# Patient Record
Sex: Female | Born: 1980 | Race: White | Hispanic: No | Marital: Married | Smoking: Never smoker
Health system: Southern US, Community
[De-identification: ages and names within clinical notes are randomized; demographics above are authoritative.]

## PROBLEM LIST (undated history)

## (undated) DIAGNOSIS — Z8759 Personal history of other complications of pregnancy, childbirth and the puerperium: Secondary | ICD-10-CM

## (undated) DIAGNOSIS — N390 Urinary tract infection, site not specified: Secondary | ICD-10-CM

## (undated) DIAGNOSIS — O1205 Gestational edema, complicating the puerperium: Secondary | ICD-10-CM

## (undated) DIAGNOSIS — Z862 Personal history of diseases of the blood and blood-forming organs and certain disorders involving the immune mechanism: Secondary | ICD-10-CM

## (undated) DIAGNOSIS — N83209 Unspecified ovarian cyst, unspecified side: Secondary | ICD-10-CM

## (undated) DIAGNOSIS — O139 Gestational [pregnancy-induced] hypertension without significant proteinuria, unspecified trimester: Secondary | ICD-10-CM

## (undated) DIAGNOSIS — Z87442 Personal history of urinary calculi: Secondary | ICD-10-CM

## (undated) DIAGNOSIS — N979 Female infertility, unspecified: Secondary | ICD-10-CM

## (undated) DIAGNOSIS — N189 Chronic kidney disease, unspecified: Secondary | ICD-10-CM

## (undated) DIAGNOSIS — IMO0001 Reserved for inherently not codable concepts without codable children: Secondary | ICD-10-CM

## (undated) DIAGNOSIS — Z8742 Personal history of other diseases of the female genital tract: Secondary | ICD-10-CM

## (undated) DIAGNOSIS — N923 Ovulation bleeding: Secondary | ICD-10-CM

## (undated) DIAGNOSIS — Z973 Presence of spectacles and contact lenses: Secondary | ICD-10-CM

## (undated) DIAGNOSIS — N3020 Other chronic cystitis without hematuria: Secondary | ICD-10-CM

## (undated) DIAGNOSIS — D62 Acute posthemorrhagic anemia: Secondary | ICD-10-CM

## (undated) HISTORY — PX: BREAST ENHANCEMENT SURGERY: SHX7

## (undated) HISTORY — PX: BREAST SURGERY: SHX581

## (undated) SURGERY — Surgical Case
Anesthesia: *Unknown

---

## 2004-12-13 ENCOUNTER — Emergency Department (HOSPITAL_COMMUNITY): Admission: EM | Admit: 2004-12-13 | Discharge: 2004-12-13 | Payer: Self-pay | Admitting: Emergency Medicine

## 2005-01-01 ENCOUNTER — Emergency Department (HOSPITAL_COMMUNITY): Admission: EM | Admit: 2005-01-01 | Discharge: 2005-01-02 | Payer: Self-pay | Admitting: Emergency Medicine

## 2007-01-13 IMAGING — CT CT ABDOMEN W/ CM
2 of 5 series · 17 of 46 positions shown, 19 images · IV contrast (omnipaque)
Comparison: None.

CLINICAL DATA: Lower abdominal pain.
 ABDOMEN CT WITH CONTRAST:
TECHNIQUE: Multidetector CT imaging of the abdomen was performed following the standard protocol during bolus administration of intravenous contrast.
 Contrast:  125 cc Omnipaque 300.
TECHNIQUE: Multidetector CT imaging of the pelvis was performed following the standard protocol during bolus administration of intravenous contrast.

[Series 2: abd_pel 5.0 b40f st · axial · 0.59mm/px · z∈[-472,-47]mm · 14 of 95 slices shown, 16 images]
[im 5/95  soft-tissue]
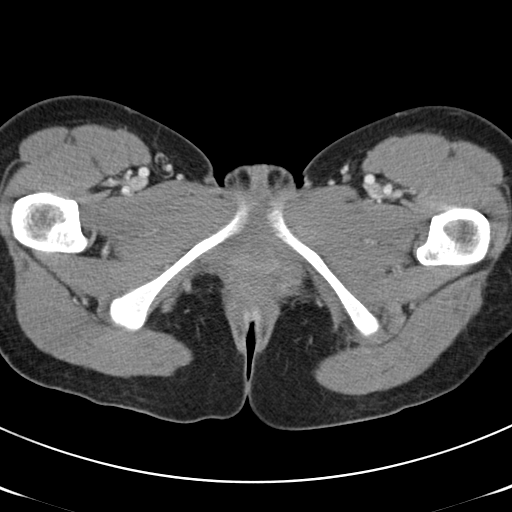
[im 5/95  bone]
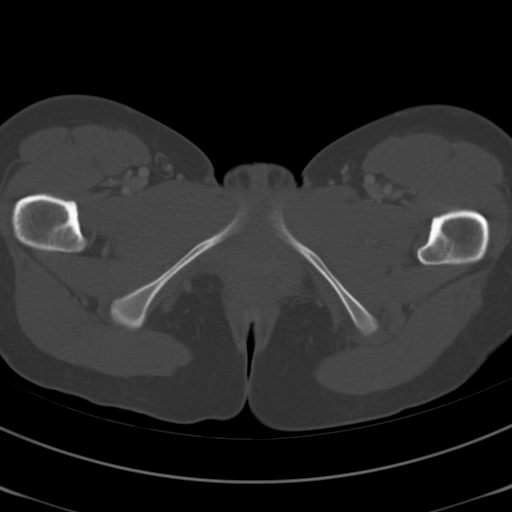
[im 15/95  soft-tissue]
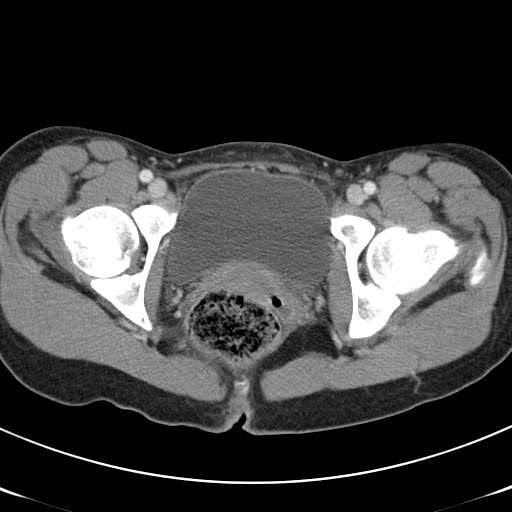
[im 19/95  soft-tissue]
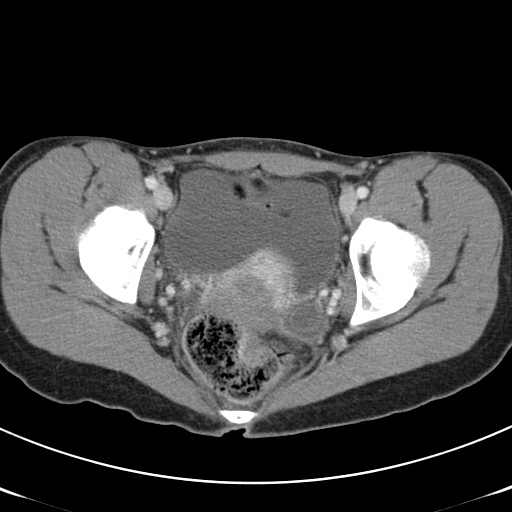
[im 24/95  soft-tissue]
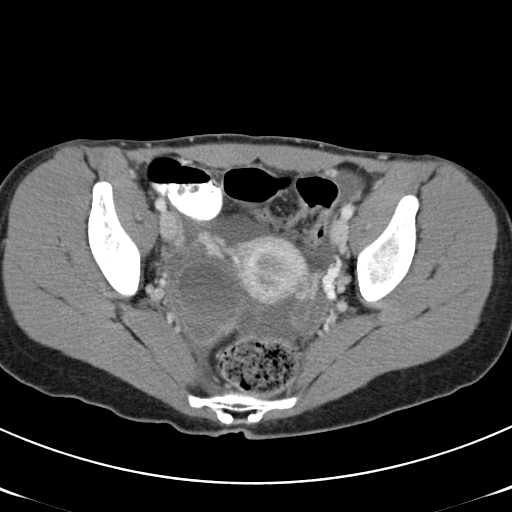
[im 33/95  soft-tissue]
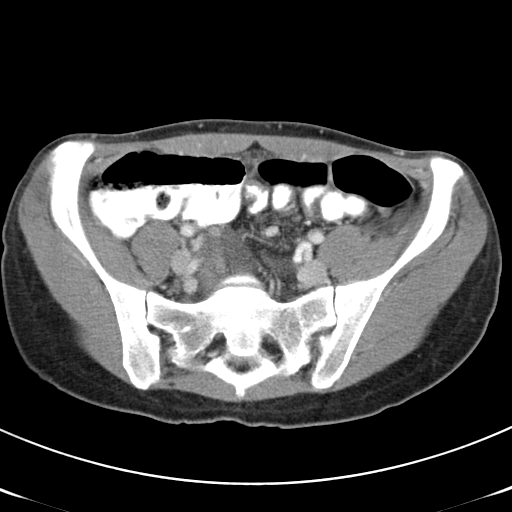
[im 38/95  soft-tissue]
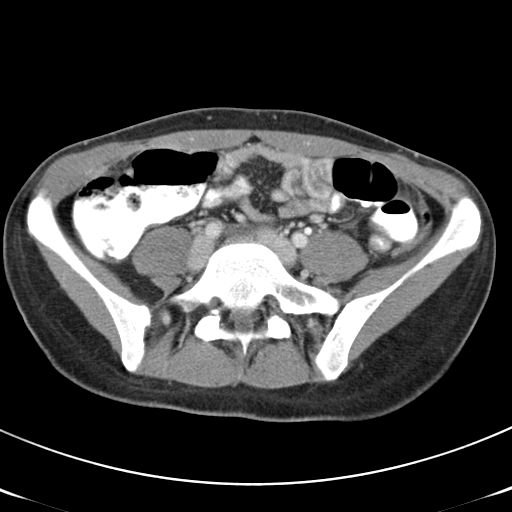
[im 43/95  soft-tissue]
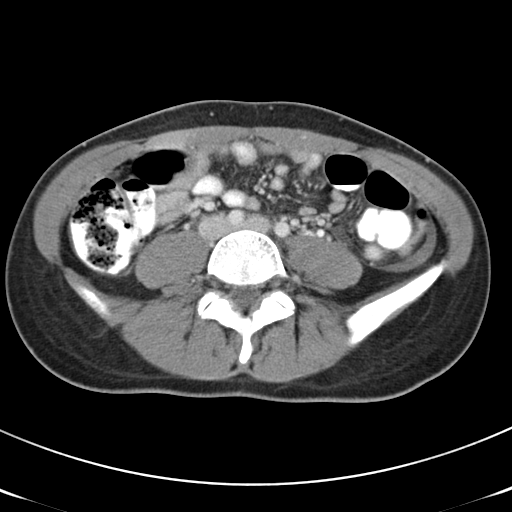
[im 52/95  soft-tissue]
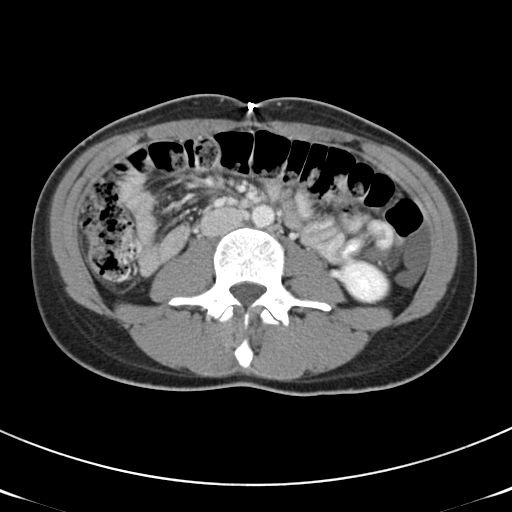
[im 57/95  soft-tissue]
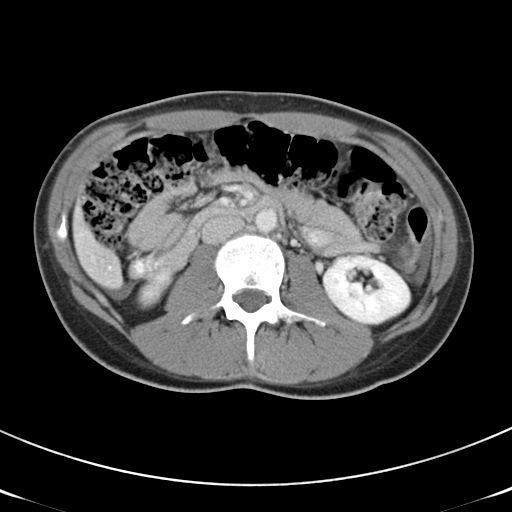
[im 57/95  bone]
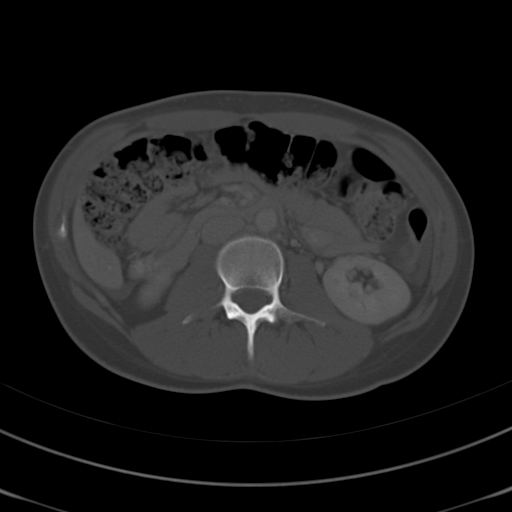
[im 62/95  soft-tissue]
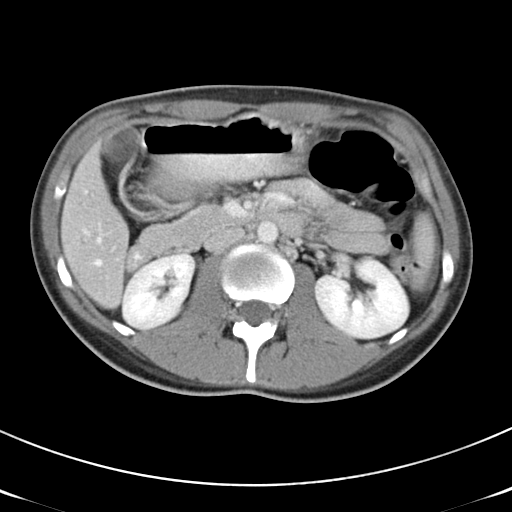
[im 71/95  soft-tissue]
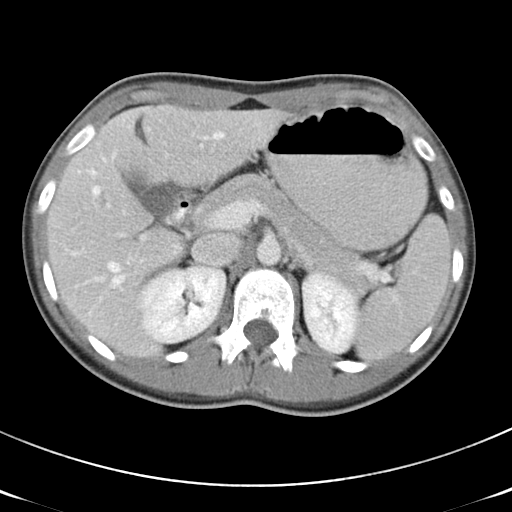
[im 76/95  soft-tissue]
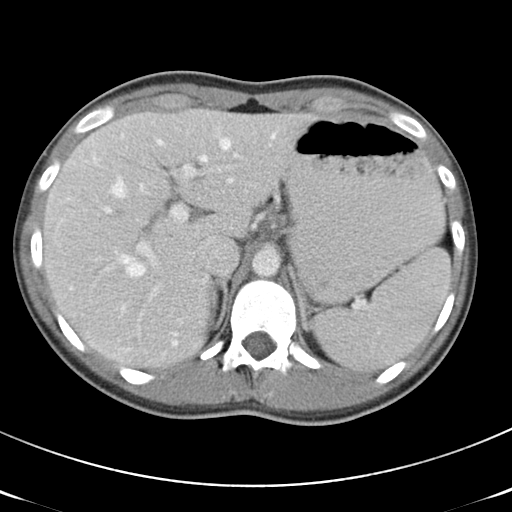
[im 80/95  soft-tissue]
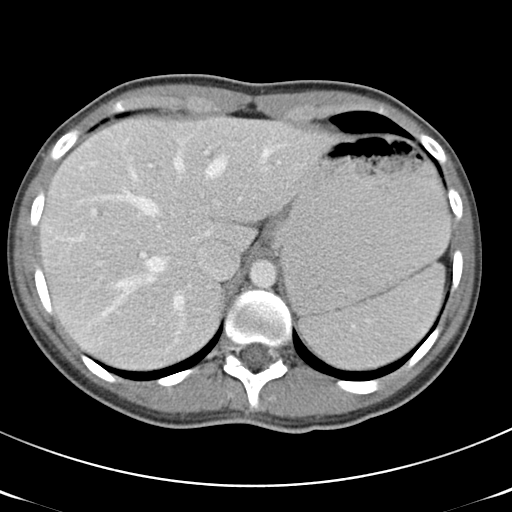
[im 90/95  soft-tissue]
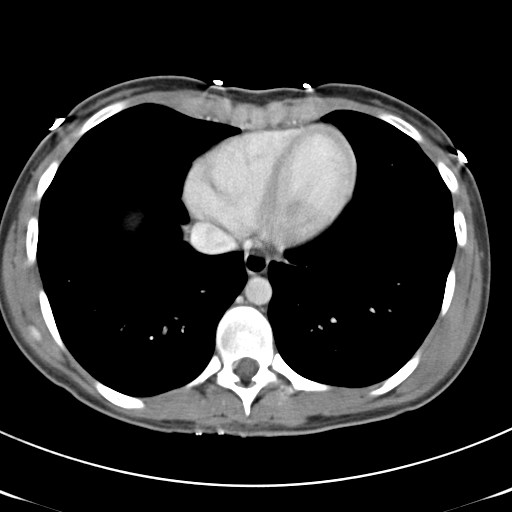

[Series 602: coronal · coronal · 0.96mm/px · 3 of 39 slices shown]
[im 13/39  soft-tissue]
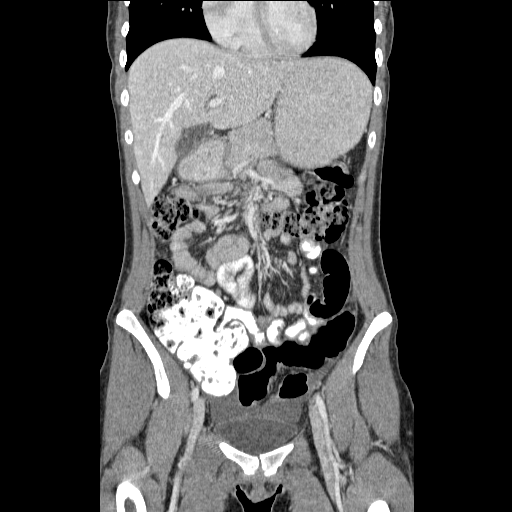
[im 17/39  soft-tissue]
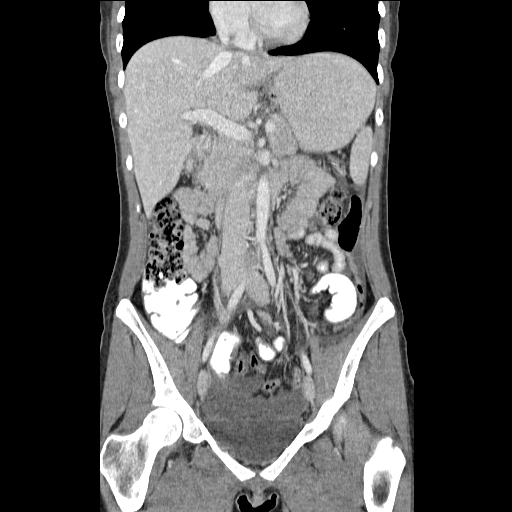
[im 22/39  soft-tissue]
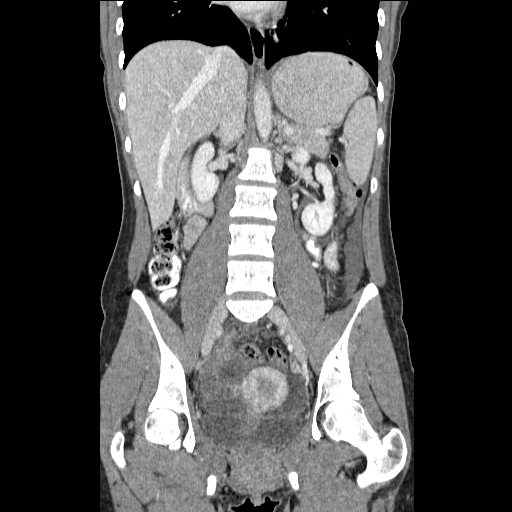

[17 of 46 positions shown; findings below may reference images not displayed]

FINDINGS: The visualized lung bases are clear.   There is no pleural or pericardial effusion.
 The liver is normal in attenuation and morphology.   The gallbladder is negative.   The pancreas is negative.   Mild intrahepatic biliary ductal dilation is noted.   
 The spleen is negative.   
 The adrenal glands are negative.   
 The right kidney is negative.
 The left kidney is negative. 
 There is no pathologic lymphadenopathy.   There is a moderate amount of fluid tracking along the left paracolic gutter.
IMPRESSION: Free fluid tracking along the left paracolic gutter. 
 PELVIS CT WITH CONTRAST:
FINDINGS: Within the right adnexa, there is a heterogeneous, predominantly fluid attenuating mass measuring approximately 6.0 x 4.6 cm (image #70).   Central fluid is identified within the uterine cavity.   There is a minimal amount of free fluid seen within the pelvis.  Within the left adnexa, there is a smaller cystic structure which may be arising from the left ovary or left adnexa.  The pelvic bowel loops are negative.
IMPRESSION: 1.  Free pelvic fluid.
 2.  Fluid attenuating mass within the right adnexa.  This may represent a large ovarian cyst.  Tuboovarian abscess may have a similar appearance.  Clinical correlation and correlation with pelvic sonography recommended.

## 2011-01-04 ENCOUNTER — Other Ambulatory Visit (HOSPITAL_COMMUNITY): Payer: Self-pay | Admitting: Obstetrics and Gynecology

## 2011-01-04 DIAGNOSIS — N979 Female infertility, unspecified: Secondary | ICD-10-CM

## 2011-01-11 ENCOUNTER — Other Ambulatory Visit (HOSPITAL_COMMUNITY): Payer: Self-pay | Admitting: Obstetrics and Gynecology

## 2011-01-11 ENCOUNTER — Ambulatory Visit (HOSPITAL_COMMUNITY)
Admission: RE | Admit: 2011-01-11 | Discharge: 2011-01-11 | Disposition: A | Discharge status: Home / Self Care | Payer: BC Managed Care – PPO | Source: Ambulatory Visit | Attending: Obstetrics and Gynecology | Admitting: Obstetrics and Gynecology

## 2011-01-11 DIAGNOSIS — N979 Female infertility, unspecified: Secondary | ICD-10-CM

## 2011-01-13 ENCOUNTER — Ambulatory Visit (HOSPITAL_COMMUNITY)
Admission: RE | Admit: 2011-01-13 | Discharge: 2011-01-13 | Disposition: A | Discharge status: Home / Self Care | Payer: BC Managed Care – PPO | Source: Ambulatory Visit | Attending: Obstetrics and Gynecology | Admitting: Obstetrics and Gynecology

## 2011-01-13 DIAGNOSIS — N979 Female infertility, unspecified: Secondary | ICD-10-CM | POA: Insufficient documentation

## 2011-01-13 MED ORDER — IOHEXOL 300 MG/ML  SOLN
3.0000 mL | Freq: Once | INTRAMUSCULAR | Status: AC | PRN
  Administered 2011-01-13: 3 mL

## 2011-06-09 HISTORY — PX: OVUM / OOCYTE RETRIEVAL: SUR1269

## 2011-07-28 LAB — OB RESULTS CONSOLE ANTIBODY SCREEN: Antibody Screen: NEGATIVE

## 2011-07-28 LAB — OB RESULTS CONSOLE ABO/RH

## 2012-02-02 NOTE — L&D Delivery Note (Signed)
Delivery Note At 10:35 PM a viable and healthy female was delivered via Vaginal, Spontaneous Delivery (Presentation:ROA ;  ).  APGAR: 9, 9; weight .pending   Placenta status: Intact, Spontaneous. Not sent  Cord: 3 vessels with the following complications: CAN x 1 reducible None.  Cord pH: none  Anesthesia: Epidural  Episiotomy: None Lacerations: Labial minora( bilateral) Suture Repair: 3.0 chromic Est. Blood Loss (mL): 250  Mom to postpartum.  Baby to nursery-stable.  Yanuel Tagg A 02/24/2012, 11:06 PM

## 2012-02-03 LAB — OB RESULTS CONSOLE GBS: GBS: NEGATIVE

## 2012-02-24 ENCOUNTER — Encounter (HOSPITAL_COMMUNITY): Payer: Self-pay | Admitting: Anesthesiology

## 2012-02-24 ENCOUNTER — Inpatient Hospital Stay (HOSPITAL_COMMUNITY)
Admission: AD | Admit: 2012-02-24 | Discharge: 2012-02-26 | DRG: 372 | Disposition: A | Discharge status: Home / Self Care | Payer: BC Managed Care – PPO | Source: Ambulatory Visit | Attending: Obstetrics and Gynecology | Admitting: Obstetrics and Gynecology

## 2012-02-24 ENCOUNTER — Inpatient Hospital Stay (HOSPITAL_COMMUNITY): Payer: BC Managed Care – PPO

## 2012-02-24 ENCOUNTER — Inpatient Hospital Stay (HOSPITAL_COMMUNITY): Payer: BC Managed Care – PPO | Admitting: Anesthesiology

## 2012-02-24 ENCOUNTER — Encounter (HOSPITAL_COMMUNITY): Payer: Self-pay | Admitting: *Deleted

## 2012-02-24 DIAGNOSIS — D62 Acute posthemorrhagic anemia: Secondary | ICD-10-CM | POA: Diagnosis not present

## 2012-02-24 DIAGNOSIS — O139 Gestational [pregnancy-induced] hypertension without significant proteinuria, unspecified trimester: Secondary | ICD-10-CM | POA: Diagnosis present

## 2012-02-24 DIAGNOSIS — O9903 Anemia complicating the puerperium: Secondary | ICD-10-CM | POA: Diagnosis not present

## 2012-02-24 HISTORY — DX: Unspecified ovarian cyst, unspecified side: N83.209

## 2012-02-24 HISTORY — DX: Chronic kidney disease, unspecified: N18.9

## 2012-02-24 HISTORY — DX: Female infertility, unspecified: N97.9

## 2012-02-24 HISTORY — DX: Urinary tract infection, site not specified: N39.0

## 2012-02-24 LAB — COMPREHENSIVE METABOLIC PANEL
AST: 16 U/L (ref 0–37)
Albumin: 2.5 g/dL — ABNORMAL LOW (ref 3.5–5.2)
Alkaline Phosphatase: 149 U/L — ABNORMAL HIGH (ref 39–117)
BUN: 12 mg/dL (ref 6–23)
CO2: 20 mEq/L (ref 19–32)
Calcium: 8.9 mg/dL (ref 8.4–10.5)
Chloride: 102 mEq/L (ref 96–112)
Creatinine, Ser: 0.66 mg/dL (ref 0.50–1.10)
GFR calc Af Amer: >90 mL/min (ref >90)
GFR calc non Af Amer: >90 mL/min (ref >90)
Glucose, Bld: 108 mg/dL — ABNORMAL HIGH (ref 70–99)
Potassium: 4.1 mEq/L (ref 3.5–5.1)
Sodium: 134 mEq/L — ABNORMAL LOW (ref 135–145)
Total Bilirubin: 0.2 mg/dL — ABNORMAL LOW (ref 0.3–1.2)
Total Protein: 5.8 g/dL — ABNORMAL LOW (ref 6.0–8.3)

## 2012-02-24 LAB — CBC
HCT: 34.9 % — ABNORMAL LOW (ref 36.0–46.0)
Hemoglobin: 11.5 g/dL — ABNORMAL LOW (ref 12.0–15.0)
MCV: 90.4 fL (ref 78.0–100.0)
Platelets: 203 10*3/uL (ref 150–400)
RBC: 3.86 MIL/uL — ABNORMAL LOW (ref 3.87–5.11)
RDW: 13.8 % (ref 11.5–15.5)
WBC: 13.1 10*3/uL — ABNORMAL HIGH (ref 4.0–10.5)

## 2012-02-24 LAB — URIC ACID: Uric Acid, Serum: 3.6 mg/dL (ref 2.4–7.0)

## 2012-02-24 MED ORDER — OXYTOCIN 40 UNITS IN LACTATED RINGERS INFUSION - SIMPLE MED
62.5000 mL/h | INTRAVENOUS | Status: DC
  Filled 2012-02-24: qty 1000

## 2012-02-24 MED ORDER — DIPHENHYDRAMINE HCL 50 MG/ML IJ SOLN: 12.5000 mg | INTRAMUSCULAR | Status: DC | PRN

## 2012-02-24 MED ORDER — LACTATED RINGERS IV SOLN: INTRAVENOUS | Status: DC

## 2012-02-24 MED ORDER — ONDANSETRON HCL 4 MG/2ML IJ SOLN
4.0000 mg | Freq: Four times a day (QID) | INTRAMUSCULAR | Status: DC | PRN
  Administered 2012-02-24: 4 mg via INTRAVENOUS
  Filled 2012-02-24: qty 2

## 2012-02-24 MED ORDER — OXYTOCIN BOLUS FROM INFUSION
500.0000 mL | INTRAVENOUS | Status: DC
  Administered 2012-02-24: 500 mL via INTRAVENOUS

## 2012-02-24 MED ORDER — OXYCODONE-ACETAMINOPHEN 5-325 MG PO TABS: 1.0000 | ORAL_TABLET | ORAL | Status: DC | PRN

## 2012-02-24 MED ORDER — ONDANSETRON HCL 4 MG/2ML IJ SOLN: 4.0000 mg | Freq: Four times a day (QID) | INTRAMUSCULAR | Status: DC | PRN

## 2012-02-24 MED ORDER — LACTATED RINGERS IV SOLN
INTRAVENOUS | Status: DC
  Administered 2012-02-24: 13:00:00 via INTRAVENOUS

## 2012-02-24 MED ORDER — OXYTOCIN BOLUS FROM INFUSION: 500.0000 mL | INTRAVENOUS | Status: DC

## 2012-02-24 MED ORDER — OXYTOCIN 10 UNIT/ML IJ SOLN: 10.0000 [IU] | Freq: Once | INTRAMUSCULAR | Status: DC

## 2012-02-24 MED ORDER — TERBUTALINE SULFATE 1 MG/ML IJ SOLN: 0.2500 mg | Freq: Once | INTRAMUSCULAR | Status: DC | PRN

## 2012-02-24 MED ORDER — ACETAMINOPHEN 325 MG PO TABS: 650.0000 mg | ORAL_TABLET | ORAL | Status: DC | PRN

## 2012-02-24 MED ORDER — LACTATED RINGERS IV SOLN: 500.0000 mL | INTRAVENOUS | Status: DC | PRN

## 2012-02-24 MED ORDER — LIDOCAINE HCL (PF) 1 % IJ SOLN: 30.0000 mL | INTRAMUSCULAR | Status: DC | PRN

## 2012-02-24 MED ORDER — TERBUTALINE SULFATE 1 MG/ML IJ SOLN: 0.2500 mg | Freq: Once | INTRAMUSCULAR | Status: AC | PRN

## 2012-02-24 MED ORDER — OXYTOCIN 40 UNITS IN LACTATED RINGERS INFUSION - SIMPLE MED
1.0000 m[IU]/min | INTRAVENOUS | Status: DC
  Administered 2012-02-24: 2 m[IU]/min via INTRAVENOUS

## 2012-02-24 MED ORDER — PHENYLEPHRINE 40 MCG/ML (10ML) SYRINGE FOR IV PUSH (FOR BLOOD PRESSURE SUPPORT): 80.0000 ug | PREFILLED_SYRINGE | INTRAVENOUS | Status: DC | PRN

## 2012-02-24 MED ORDER — EPHEDRINE 5 MG/ML INJ
10.0000 mg | INTRAVENOUS | Status: DC | PRN
  Filled 2012-02-24: qty 4

## 2012-02-24 MED ORDER — PHENYLEPHRINE 40 MCG/ML (10ML) SYRINGE FOR IV PUSH (FOR BLOOD PRESSURE SUPPORT)
80.0000 ug | PREFILLED_SYRINGE | INTRAVENOUS | Status: DC | PRN
  Filled 2012-02-24: qty 5

## 2012-02-24 MED ORDER — IBUPROFEN 600 MG PO TABS: 600.0000 mg | ORAL_TABLET | Freq: Four times a day (QID) | ORAL | Status: DC | PRN

## 2012-02-24 MED ORDER — LACTATED RINGERS IV SOLN
500.0000 mL | Freq: Once | INTRAVENOUS | Status: AC
  Administered 2012-02-24: 1000 mL via INTRAVENOUS

## 2012-02-24 MED ORDER — OXYTOCIN 40 UNITS IN LACTATED RINGERS INFUSION - SIMPLE MED: 1.0000 m[IU]/min | INTRAVENOUS | Status: DC

## 2012-02-24 MED ORDER — FENTANYL 2.5 MCG/ML BUPIVACAINE 1/10 % EPIDURAL INFUSION (WH - ANES)
14.0000 mL/h | INTRAMUSCULAR | Status: DC
  Administered 2012-02-24: 14 mL/h via EPIDURAL
  Filled 2012-02-24: qty 125

## 2012-02-24 MED ORDER — OXYTOCIN 40 UNITS IN LACTATED RINGERS INFUSION - SIMPLE MED: 62.5000 mL/h | INTRAVENOUS | Status: DC

## 2012-02-24 MED ORDER — SODIUM BICARBONATE 8.4 % IV SOLN
INTRAVENOUS | Status: DC | PRN
  Administered 2012-02-24: 5 mL via EPIDURAL

## 2012-02-24 MED ORDER — LIDOCAINE HCL (PF) 1 % IJ SOLN
30.0000 mL | INTRAMUSCULAR | Status: DC | PRN
  Filled 2012-02-24: qty 30

## 2012-02-24 MED ORDER — CITRIC ACID-SODIUM CITRATE 334-500 MG/5ML PO SOLN: 30.0000 mL | ORAL | Status: DC | PRN

## 2012-02-24 MED ORDER — EPHEDRINE 5 MG/ML INJ: 10.0000 mg | INTRAVENOUS | Status: DC | PRN

## 2012-02-24 NOTE — MAU Note (Signed)
Sent from office, BP elevation, Protein in urine

## 2012-02-24 NOTE — Progress Notes (Signed)
S: comfortable denies rectal pressure  O: VS BP 114/58 Epidural Pitocin VE( RN) 6/100/0 station Tracing: early decel baseline 140 Ctx q 2 1/2 mins  ImP: active phase Term P)  Cont present mgmt

## 2012-02-24 NOTE — Anesthesia Procedure Notes (Signed)

## 2012-02-24 NOTE — Anesthesia Preprocedure Evaluation (Signed)
Anesthesia Evaluation  Patient identified by MRN, date of birth, ID band Patient awake    Reviewed: Allergy & Precautions, H&P , Patient's Chart, lab work & pertinent test results  Airway Mallampati: II TM Distance: >3 FB Neck ROM: full    Dental No notable dental hx.    Pulmonary neg pulmonary ROS,  breath sounds clear to auscultation  Pulmonary exam normal       Cardiovascular hypertension, negative cardio ROS  Rhythm:regular Rate:Normal     Neuro/Psych negative neurological ROS  negative psych ROS   GI/Hepatic negative GI ROS, Neg liver ROS,   Endo/Other  negative endocrine ROS  Renal/GU Renal diseasenegative Renal ROS     Musculoskeletal   Abdominal   Peds  Hematology negative hematology ROS (+)   Anesthesia Other Findings   Reproductive/Obstetrics (+) Pregnancy                           Anesthesia Physical Anesthesia Plan  ASA: III  Anesthesia Plan: Epidural   Post-op Pain Management:    Induction:   Airway Management Planned:   Additional Equipment:   Intra-op Plan:   Post-operative Plan:   Informed Consent: I have reviewed the patients History and Physical, chart, labs and discussed the procedure including the risks, benefits and alternatives for the proposed anesthesia with the patient or authorized representative who has indicated his/her understanding and acceptance.     Plan Discussed with:   Anesthesia Plan Comments:         Anesthesia Quick Evaluation

## 2012-02-24 NOTE — Progress Notes (Signed)
S: min ctx   2nd decel noted again after prolonged ctx  followed by reactive tracing (+) irreg ctx AFI =18  VE: 1/70/-2. Post BP 132/68 Arom: clear fluid IUPC/ISE placed  IMP: Intermittent FHR decelerations w/ reassuring tracing otherwise  P) start pitocin. If intol to labor, C/S pt agrees with plan

## 2012-02-24 NOTE — MAU Provider Note (Signed)
History    CC. elev BP in office No chief complaint on file. 32 yo G1P0 MWF now @ 39 1/[redacted] weeks gestation sent from office for further evaluation of elevated BP( BP 140/90), proteinuria( 100mg )> pt denies h/a, visual changes or epigastric pain. PNC uncomplicated   OB History    Grav Para Term Preterm Abortions TAB SAB Ect Mult Living   1 0 0 0 0 0 0 0 0 0       Past Medical History  Diagnosis Date  . 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 Urinary tract infection   . Chronic kidney disease     kidney stones  . Ovarian cyst     Past Surgical History  Procedure Date  . Breast surgery     augmentation    Family History  Problem Relation Age of Onset  . Other Neg Hx     History  Substance Use Topics  . Smoking status: Never Smoker   . Smokeless tobacco: Never Used  . Alcohol Use: Yes     Comment: occ, none with preg    Allergies: No Known Allergies  Prescriptions prior to admission  Medication Sig Dispense Refill  . acetaminophen (TYLENOL) 500 MG tablet Take 500 mg by mouth every 6 (six) hours as needed.      . Magnesium 250 MG TABS Take 1 tablet by mouth daily.      . Prenatal Vit-Fe Fumarate-FA (PRENATAL MULTIVITAMIN) TABS Take 1 tablet by mouth daily.         Physical Exam   Blood pressure 138/77, pulse 82, temperature 100 F (37.8 C), temperature source Oral, resp. rate 20, height 5' 4.5" (1.638 m), weight 74.208 kg (163 lb 9.6 oz).  No exam performed today, exam done in office. ED Course  PIH labs normal. U/a neg for protein here .  Tracing baseline 140 reactive   Irregular ctx then pt had 6 mins decel with some maternal FHR after long ctx( documented w/ firm uterus per RN) then reactive tracing again IMP: Fetal heart rate deceleration related to prolonged ctx. P) OCT   MDM  Ainara Eldridge A, MD 1:12 PM 02/24/2012    2:15 PM:  Called by Rn due to neg CST on own by pt. Tracing reviewed. Again reactive w/ short variables P) AFI. If normal,  will admit for observation of tracing

## 2012-02-24 NOTE — MAU Note (Signed)
Dr. Cherly Hensen notified pt having decel lasting 6 minutes, returning to baseline, abdomen palpates firm, orders to admit pt to [facility] with routine orders.

## 2012-02-24 NOTE — H&P (Signed)
Claudia Claudia [name] is Claudia [name] 32 y.o. female presenting for admission @ 39 1/7 weeks after intermittent prolonged deceleration. Pt was initially sent to hosp for evaluation of elev BP and proteinuria at the office. PIH labs were normal but pt just prior to planned d/c had Claudia [name] prolonged decel. Subsequent eval showed neg CST. NL AFI but cont decel result in decision to proceed with induction of labor. IVF pregnancy History OB History    Grav Para Term Preterm Abortions TAB SAB Ect Mult Living   1 0 0 0 0 0 0 0 0 0      Past Medical History  Diagnosis Date  . 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 Urinary tract infection   . Chronic kidney disease     kidney stones  . Ovarian cyst    Past Surgical History  Procedure Date  . Breast surgery     augmentation   Family History: family history is negative for Other. Social History:  reports that she has never smoked. She has never used smokeless tobacco. She reports that she drinks alcohol. She reports that she does not use illicit drugs.   Prenatal Transfer Tool  Maternal Diabetes: No Genetic Screening: Normal Maternal Ultrasounds/Referrals: Normal Fetal Ultrasounds or other Referrals:  None Maternal Substance Abuse:  No Significant Maternal Medications:  None Significant Maternal Lab Results:  Lab values include: Group B Strep negative Other Comments:  None  Review of Systems  All other systems reviewed and are negative.    Dilation: 1 Effacement (%): 80 Station: -2 Blood pressure 125/67, pulse 69, temperature 98 F (36.7 C), temperature source Oral, resp. rate 16, height 5\' 5"  (1.651 m), weight 73.936 kg (163 lb), last menstrual period 05/26/2011. Maternal Exam:  Uterine Assessment: Contraction strength is mild.  Contraction frequency is irregular.   Abdomen: Patient reports no abdominal tenderness. Estimated fetal weight is 7lb.   Fetal presentation: vertex  Introitus: Normal vulva. Ferning test: not done.  Nitrazine test: not  done. Amniotic fluid character: not assessed.  Pelvis: adequate for delivery.   Cervix: Cervix evaluated by digital exam.     Physical Exam  Constitutional: She is oriented to person, place, and time. She appears well-developed and well-nourished.  HENT:  Head: Atraumatic.  Eyes: EOM are normal.  Neck: Neck supple.  Cardiovascular: Regular rhythm.   Respiratory: Breath sounds normal.  GI: Soft.  Musculoskeletal: She exhibits edema.  Neurological: She is alert and oriented to person, place, and time.  Skin: Skin is warm and dry.  Psychiatric: She has Claudia [name] normal mood and affect.    Prenatal labs: ABO, Rh: O/Positive/-- (06/26 0000) Antibody: Negative (06/26 0000) Rubella: Immune (06/26 0000) RPR: Nonreactive (06/26 0000)  HBsAg: Negative (06/26 0000)  HIV: Non-reactive (06/26 0000)  GBS: Negative (01/02 0000)   Assessment/Plan: Intermittent fetal heart rate deceleration Term gestation P) admit routine labs. Amniotomy. Epidural Pitocin induction   Claudia Claudia [name] 02/24/2012, 6:35 PM

## 2012-02-24 NOTE — Progress Notes (Signed)
Talked with Dr Cherly Hensen per phone.  No new orders.  Will observe pt

## 2012-02-24 NOTE — MAU Note (Signed)
@  1217 pt had called out to go to restroom, noted prolonged contraction.  Uterus firm on palpation, fh not tracing continuously, audible tone consistent with pt's radial pulse. 1219 pt to left side. 1221 to rt side, FH 90-110 up. @ 1222 uterine tone relaxing and FH returning to baseline.

## 2012-02-25 DIAGNOSIS — O139 Gestational [pregnancy-induced] hypertension without significant proteinuria, unspecified trimester: Secondary | ICD-10-CM | POA: Diagnosis present

## 2012-02-25 DIAGNOSIS — D62 Acute posthemorrhagic anemia: Secondary | ICD-10-CM | POA: Diagnosis not present

## 2012-02-25 LAB — CBC
Hemoglobin: 9.8 g/dL — ABNORMAL LOW (ref 12.0–15.0)
MCH: 29.3 pg (ref 26.0–34.0)
MCHC: 32.5 g/dL (ref 30.0–36.0)
Platelets: 162 10*3/uL (ref 150–400)

## 2012-02-25 LAB — ABO/RH: ABO/RH(D): O POS

## 2012-02-25 LAB — RPR: RPR Ser Ql: NONREACTIVE

## 2012-02-25 MED ORDER — LANOLIN HYDROUS EX OINT: TOPICAL_OINTMENT | CUTANEOUS | Status: DC | PRN

## 2012-02-25 MED ORDER — ONDANSETRON HCL 4 MG PO TABS: 4.0000 mg | ORAL_TABLET | ORAL | Status: DC | PRN

## 2012-02-25 MED ORDER — BENZOCAINE-MENTHOL 20-0.5 % EX AERO
1.0000 | INHALATION_SPRAY | CUTANEOUS | Status: DC | PRN
Start: 2012-02-25 — End: 2012-02-26
  Filled 2012-02-25 (×2): qty 56

## 2012-02-25 MED ORDER — DIPHENHYDRAMINE HCL 25 MG PO CAPS: 25.0000 mg | ORAL_CAPSULE | Freq: Four times a day (QID) | ORAL | Status: DC | PRN

## 2012-02-25 MED ORDER — PRENATAL MULTIVITAMIN CH
1.0000 | ORAL_TABLET | Freq: Every day | ORAL | Status: DC
  Administered 2012-02-25 – 2012-02-26 (×2): 1 via ORAL
  Filled 2012-02-25 (×2): qty 1

## 2012-02-25 MED ORDER — IBUPROFEN 600 MG PO TABS
600.0000 mg | ORAL_TABLET | Freq: Four times a day (QID) | ORAL | Status: DC
  Administered 2012-02-25 – 2012-02-26 (×7): 600 mg via ORAL
  Filled 2012-02-25 (×7): qty 1

## 2012-02-25 MED ORDER — ONDANSETRON HCL 4 MG/2ML IJ SOLN: 4.0000 mg | INTRAMUSCULAR | Status: DC | PRN

## 2012-02-25 MED ORDER — ZOLPIDEM TARTRATE 5 MG PO TABS: 5.0000 mg | ORAL_TABLET | Freq: Every evening | ORAL | Status: DC | PRN

## 2012-02-25 MED ORDER — POLYSACCHARIDE IRON COMPLEX 150 MG PO CAPS
150.0000 mg | ORAL_CAPSULE | Freq: Every day | ORAL | Status: DC
  Administered 2012-02-26: 150 mg via ORAL
  Filled 2012-02-25 (×2): qty 1

## 2012-02-25 MED ORDER — OXYCODONE-ACETAMINOPHEN 5-325 MG PO TABS
1.0000 | ORAL_TABLET | ORAL | Status: DC | PRN
  Administered 2012-02-26: 1 via ORAL
  Filled 2012-02-25: qty 1

## 2012-02-25 MED ORDER — DIBUCAINE 1 % RE OINT
1.0000 "application " | TOPICAL_OINTMENT | RECTAL | Status: DC | PRN
  Filled 2012-02-25: qty 28

## 2012-02-25 MED ORDER — SENNOSIDES-DOCUSATE SODIUM 8.6-50 MG PO TABS
2.0000 | ORAL_TABLET | Freq: Every day | ORAL | Status: DC
  Administered 2012-02-25: 2 via ORAL

## 2012-02-25 MED ORDER — WITCH HAZEL-GLYCERIN EX PADS: 1.0000 "application " | MEDICATED_PAD | CUTANEOUS | Status: DC | PRN

## 2012-02-25 MED ORDER — SIMETHICONE 80 MG PO CHEW: 80.0000 mg | CHEWABLE_TABLET | ORAL | Status: DC | PRN

## 2012-02-25 NOTE — Addendum Note (Signed)
Addendum  created 02/25/12 1478 by Suella Grove, CRNA   Modules edited:Charges VN, Notes Section

## 2012-02-25 NOTE — Anesthesia Postprocedure Evaluation (Signed)
  Anesthesia Post-op Note  Patient: Claudia [name]  Procedure(s) Performed: * No procedures listed *  Patient Location: Mother/Baby  Anesthesia Type:Epidural  Level of Consciousness: awake  Airway and Oxygen Therapy: Patient Spontanous Breathing  Post-op Pain: none  Post-op Assessment: Patient's Cardiovascular Status Stable, Respiratory Function Stable, Patent Airway, No signs of Nausea or vomiting, Adequate PO intake, Pain level controlled, No headache, No backache, No residual numbness and No residual motor weakness  Post-op Vital Signs: Reviewed and stable  Complications: No apparent anesthesia complications

## 2012-02-25 NOTE — Addendum Note (Signed)
Addendum  created 02/25/12 0811 by Roper Tolson C Tonae Livolsi, CRNA   Modules edited:Charges VN, Notes Section    

## 2012-02-25 NOTE — Progress Notes (Signed)
PPD 1 SVD  S:  Reports feeling well - really tired - not much sleep after delivery last night / no PIH signs             Tolerating po/ No nausea or vomiting             Bleeding is light             Pain controlled with motrin and percocet prn             Up ad lib / ambulatory / voiding QS  Newborn breast-feeding  / Circumcision pending today - Dr Cherly Hensen awaiting clearance from Peds  O:               VS: BP 143/77  Pulse 62  Temp 98.2 F (36.8 C) (Oral)  Resp 20  Ht 5\' 5"  (1.651 m)  Wt 73.936 kg (163 lb)  BMI 27.12 kg/m2  LMP 01/04/2011  Breastfeeding? Unknown  BP range:  112 - 143/ 56-77   LABS:  Basename 02/25/12 0520 02/24/12 1132  WBC 13.1* 13.1*  HGB 9.8* 11.5*  PLT 162 203                  I&O  -600                     Physical Exam:             Alert and oriented X3  Lungs: Clear and unlabored  Heart: regular rate and rhythm / no mumurs  Abdomen: soft, non-tender, non-distended              Fundus: firm, non-tender, U-1  Perineum: mild edema  Lochia: light   Extremities: no edema, no calf pain or tenderness    A: PPD # 1 -doing well / stable status             Mild PIH - BP stable / no evidence of preeclampsia             Mild ABL anemia - hemodynamically stable  P:  Routine post partum orders  Anticipate discharge tomorrow             Lactation assist as needed             Newborn circ prior to DC home             Iron x 4 weeks for IDA  Marlinda Mike CNM, MSN 02/25/2012, 9:07 AM

## 2012-02-26 MED ORDER — POLYSACCHARIDE IRON COMPLEX 150 MG PO CAPS: 150.0000 mg | ORAL_CAPSULE | Freq: Every day | ORAL | Status: DC

## 2012-02-26 MED ORDER — OXYCODONE-ACETAMINOPHEN 5-325 MG PO TABS: 1.0000 | ORAL_TABLET | ORAL | Status: DC | PRN

## 2012-02-26 MED ORDER — IBUPROFEN 600 MG PO TABS: 600.0000 mg | ORAL_TABLET | Freq: Four times a day (QID) | ORAL | Status: DC | PRN

## 2012-02-26 NOTE — Discharge Summary (Signed)
Physician Discharge Summary  Patient ID: Claudia [name] MRN: [id number] DOB/AGE: Aug 26, 1980 32 y.o.  Admit date: 02/24/2012 Discharge date: 02/26/2012  Admission Diagnoses:Admitted as had prolonged deceleration during fetal monitoring testing at [redacted]w[redacted]d. Followed with negative CST But decided to procedure with IOL. IVF pregnancy.   Discharge Diagnoses:  Principal Problem:  *Postpartum care following vaginal delivery (1/23) Active Problems:  SVD (spontaneous vaginal delivery) w/ labial lac  PIH (pregnancy induced hypertension), antepartum  Anemia due to acute blood loss w/ childbirth   Discharged Condition: stable  Hospital Course: Uncomplicated, NSVD  Consults: None Significant Diagnostic Studies: labs: Routine PN labs normal and radiology: Ultrasound: anatomy  Treatments: IV hydration and analgesia: acetaminophen w/ codeine and ibuprofen Discharge Exam: Blood pressure 143/94, pulse 66, temperature 97.7 F (36.5 C), temperature source Oral, resp. rate 18, height 5\' 5"  (1.651 m), weight 163 lb (73.936 kg), last menstrual period 01/04/2011, unknown if currently breastfeeding.  General appearance: alert, cooperative and no distress Affect: AAO x 3 Lungs:CTAB CV: RRR Breasts: Bilat N/T Abdomen:Soft, N/T, B/S x 4, Flatus and BM Fundus: -2/u GU: no problems voiding GI: Normal diet Lochia: Rubra - normal Perineum: healing well, Peri-care advised Extremities: No edema /swelling bilaterally   Disposition: Final discharge disposition not confirmed  Discharge Orders    Future Orders Please Complete By Expires   Diet - low sodium heart healthy      Discharge instructions      Comments:   Per [facility] Booklet       Medication List     As of 02/26/2012 12:06 PM    STOP taking these medications         Magnesium 250 MG Tabs      TAKE these medications         acetaminophen 500 MG tablet   Commonly known as: TYLENOL   Take 500 mg by mouth every 6 (six) hours as  needed.      ibuprofen 600 MG tablet   Commonly known as: ADVIL,MOTRIN   Take 1 tablet (600 mg total) by mouth every 6 (six) hours as needed for pain.      iron polysaccharides 150 MG capsule   Commonly known as: NIFEREX   Take 1 capsule (150 mg total) by mouth daily.      oxyCODONE-acetaminophen 5-325 MG per tablet   Commonly known as: PERCOCET/ROXICET   Take 1-2 tablets by mouth every 4 (four) hours as needed (moderate - severe pain).      prenatal multivitamin Tabs   Take 1 tablet by mouth daily.           Follow-up Information    Follow up with [facility] Schedule an appointment as soon as possible for a visit in 6 weeks.   Contact information:   [address] [place] [address] [phone]         Signed: Earl Gala, CNM. 02/26/2012, 12:06 PM

## 2012-02-26 NOTE — Progress Notes (Signed)
Post Partum Day 2 NSVD without complications viable female Subjective: no complaints, up ad lib without syncope, voiding, tolerating PO, + flatus, +BM  Pain well controlled with po meds, taking motrin and percocet  BF: On demand and seen by lactation Mood stable, bonding well   Objective: Blood pressure 143/94, pulse 66, temperature 97.7 F (36.5 C), temperature source Oral, resp. rate 18, height 5\' 5"  (1.651 m), weight 163 lb (73.936 kg), last menstrual period 01/04/2011, unknown if currently breastfeeding.  Physical Exam:  General: alert, cooperative and no distress Lungs: CTAB Breasts: N/T Heart: RRR Lochia: appropriate Uterine Fundus: firm Perineum: DVT Evaluation: No evidence of DVT seen on physical exam. Negative Homan's sign. No cords or calf tenderness. No significant calf/ankle edema.   Basename 02/25/12 0520 02/24/12 1132  HGB 9.8* 11.5*  HCT 30.2* 34.9*   Anemia of pregnancy - delivered.  Assessment/Plan: Discharge home Hospital circumcision completed.      LOS: 2 days   Claudia [name] 02/26/2012, 10:51 AM

## 2012-02-27 NOTE — Discharge Summary (Signed)
Reviewed and agree with note and plan. V.Estell Puccini, MD  

## 2012-02-28 ENCOUNTER — Ambulatory Visit (HOSPITAL_COMMUNITY): Payer: BC Managed Care – PPO | Attending: Obstetrics and Gynecology

## 2012-02-28 NOTE — Consult Note (Signed)
Outpatient walk-in  Referred by baby's pediatrician related to 10% weight loss and no output in 24 hours.  Pediatrician suggested an SNS in the event that Claudia [name] had no output by tomorrow.  Mother is using a nipple shield.  He is able to latch deeply but has difficulty transferring.  He does not soften the breast.  Suck assessment reveals difficulty maintaining suction.  It improved somewhat with tongue exercises.  Breast compression did help him to transfer more as evidenced by a softer breast. He also had a brown BM and small void while he was at the consult.  Encouraged mother to feed with cues. Ensure a deep latch. Use breast compressions to assist with transfer. Pump milk and feed back to [name] if he does not soften the breast. Smart Start to home on 03/01/12. Lactation appointment on 03/03/12.

## 2012-03-02 ENCOUNTER — Ambulatory Visit (HOSPITAL_COMMUNITY): Admission: RE | Admit: 2012-03-02 | Payer: BC Managed Care – PPO | Source: Ambulatory Visit

## 2012-03-03 ENCOUNTER — Ambulatory Visit (HOSPITAL_COMMUNITY): Payer: BC Managed Care – PPO

## 2012-03-07 ENCOUNTER — Ambulatory Visit (HOSPITAL_COMMUNITY)
Admission: RE | Admit: 2012-03-07 | Discharge: 2012-03-07 | Disposition: A | Discharge status: Home / Self Care | Payer: BC Managed Care – PPO | Source: Ambulatory Visit | Attending: Pediatrics | Admitting: Pediatrics

## 2012-03-07 NOTE — Progress Notes (Addendum)
Adult Lactation Consultation Outpatient Visit Note  Patient Name: Claudia HEARN                  DOB: [birth year] Date of Birth: May 22, 1980                             BW; 7-3 Gestational Age at Delivery: [redacted]w[redacted]d             todays weight: 7-11.2, 3492 Type of Delivery:   Breastfeeding History: Frequency of Breastfeeding: mother is pumping and bottle feeding Length of Feeding:  Voids: 8-10 Stools: 6-8 yellow brown seedy  Supplementing / Method: Pumping:  Type of Pump: Medela pump n style   Frequency: every 2hours for 20 mins  Volume:  2-3 ounces  Comments: Mother developed late Hypertension after hospital discharge. She is taking HCTZ 25 mg and her BP is still 140/90.   Mother was discharged from hospital using #20 nipple shield. Mother has become discouraged due to marathon feeding and very sore cracked nipples. MD also encouraged mother to pump and bottle feed so she could get lots of rest , which would help BP. Mother has chosen to pump and bottle feed only. Mothers nipples are now intact without any signs of nipple trauma. Mothers breast are full and firm.  Mother has concerns that she will not make enough milk. She doesn't want to use any formula for supplement.  Father of infant states that they feel that pumping and bottle feeding is the best plan for them,  Consultation Evaluation:  Mother was assisted with pumping for 20 mins and obtained 85 ml. Mother was taught to hand express before and after pumping for 2-3 mins to increase milk volume. Mother also taught how to do good breast massage and power pumping was discussed. Mother had lots of questions on increasing milk supply. Father had lots of questions on soothing infant and allowing infant to cry it out and training infant to sleep through the night. Father states that this method was from book, " Armanda Heritage." information was given on understanding infants need for security and need for bonding. Parents were cautioned about  reading this information and discussing infants care with multiple persons. Encouraged parents to seek sound advise from infants provider. Mother is producing approximately 620 ml of EBM daily.  Initial Feeding Assessment: Pre-feed Weight: Post-feed Weight: Amount Transferred: Comments:  Additional Feeding Assessment: Pre-feed Weight: Post-feed Weight: Amount Transferred: Comments:  Additional Feeding Assessment: Pre-feed Weight: Post-feed Weight: Amount Transferred: Comments:  Total Breast milk Transferred this Visit:  Total Supplement Given:   Additional Interventions: mother to feed infant every 2-3 hours giving at least 3 ounces of EBM with bottle. Mother was inst to continue to pump at least every 2-3 hours. inst to do good breast massage and to  hand expression at least 5 or more times before and after pumping for at least 2-3 mins. Mother inst to try power pumping as option.  .Mother advised to nap frequently and receive lots of family assistance with infant care.  Follow-Up  PRN    Stevan Born [facility] 03/07/2012, 1:57 PM

## 2013-12-03 ENCOUNTER — Encounter (HOSPITAL_COMMUNITY): Payer: Self-pay | Admitting: *Deleted

## 2014-10-15 LAB — OB RESULTS CONSOLE ABO/RH: RH Type: POSITIVE

## 2014-10-15 LAB — OB RESULTS CONSOLE ANTIBODY SCREEN: Antibody Screen: NEGATIVE

## 2014-10-15 LAB — OB RESULTS CONSOLE HIV ANTIBODY (ROUTINE TESTING)
HIV: NONREACTIVE
HIV: NONREACTIVE

## 2014-10-15 LAB — OB RESULTS CONSOLE RPR
RPR: NONREACTIVE
RPR: NONREACTIVE

## 2014-10-15 LAB — OB RESULTS CONSOLE HEPATITIS B SURFACE ANTIGEN: HEP B S AG: NEGATIVE

## 2014-10-15 LAB — OB RESULTS CONSOLE RUBELLA ANTIBODY, IGM: RUBELLA: IMMUNE

## 2014-10-23 LAB — OB RESULTS CONSOLE GC/CHLAMYDIA
CHLAMYDIA, DNA PROBE: NEGATIVE
GC PROBE AMP, GENITAL: NEGATIVE

## 2015-02-02 NOTE — L&D Delivery Note (Signed)
Operative Delivery Note At 11:46 AM a viable and healthy female was delivered via Vaginal, Vacuum Investment banker, operational(Extractor).  Presentation: vertex; Position: Occiput,, Anterior; Station: +2.  Verbal consent: obtained from patient.  Risks and benefits discussed in detail.  Risks include, but are not limited to the risks of anesthesia, bleeding, infection, damage to maternal tissues, fetal cephalhematoma.  There is also the risk of inability to effect vaginal delivery of the head, or shoulder dystocia that cannot be resolved by established maneuvers, leading to the need for emergency cesarean section. INDICATION: repetitive deep variable decelerations. Pedi called APGAR: 8, 9; weight 6 lb 5.1 oz (2865 g).    Placenta status: Abnormal Adherent, Pathology Manual removal Retained Curettage using Banjo curet.   Cord: 3 vessels with the following complications: Hyperspiraled Long Velamentous.  Cord pH: none  Placenta injected with 10ml pitocin in the cord. Delivery of long cord with membranes subsequently noted c/w velamentous insertion. Fundal left cornual location. Unable to manually remove. SL nitroglycerin x 2 sprays done With partial removal of placenta. Repeat dose of SL nitroglycerin given, result in ability to remove remaining placental tissue. Manual sweep and Banjo curet used for exploration of cavity.  All tissue felt to be removed Maternal hypotension 2nd to SL nitroglycerin.. Fentanyl given for pain mgmt  Anesthesia: Epidural  Instruments: mushroom Episiotomy: None Lacerations: 2nd degree;Perineal Suture Repair: 3.0 chromic Est. Blood Loss (mL): 549cc    Mom to postpartum.  Baby to Couplet care / Skin to Skin.  Maikayla Beggs A 04/23/2015, 1:23 PM

## 2015-02-04 DIAGNOSIS — Z36 Encounter for antenatal screening of mother: Secondary | ICD-10-CM | POA: Diagnosis not present

## 2015-02-20 DIAGNOSIS — Z36 Encounter for antenatal screening of mother: Secondary | ICD-10-CM | POA: Diagnosis not present

## 2015-02-20 DIAGNOSIS — Z3A29 29 weeks gestation of pregnancy: Secondary | ICD-10-CM | POA: Diagnosis not present

## 2015-02-20 DIAGNOSIS — O0903 Supervision of pregnancy with history of infertility, third trimester: Secondary | ICD-10-CM | POA: Diagnosis not present

## 2015-02-20 DIAGNOSIS — Z23 Encounter for immunization: Secondary | ICD-10-CM | POA: Diagnosis not present

## 2015-03-20 DIAGNOSIS — O0903 Supervision of pregnancy with history of infertility, third trimester: Secondary | ICD-10-CM | POA: Diagnosis not present

## 2015-03-20 DIAGNOSIS — Z3A33 33 weeks gestation of pregnancy: Secondary | ICD-10-CM | POA: Diagnosis not present

## 2015-03-28 MED FILL — PANTOPRAZOLE SOD DR 40 MG T: 40 | 90 days supply | Qty: 90 | Fill #1

## 2015-04-01 DIAGNOSIS — O0903 Supervision of pregnancy with history of infertility, third trimester: Secondary | ICD-10-CM | POA: Diagnosis not present

## 2015-04-01 DIAGNOSIS — Z3A35 35 weeks gestation of pregnancy: Secondary | ICD-10-CM | POA: Diagnosis not present

## 2015-04-01 DIAGNOSIS — Z36 Encounter for antenatal screening of mother: Secondary | ICD-10-CM | POA: Diagnosis not present

## 2015-04-01 LAB — OB RESULTS CONSOLE GBS: GBS: NEGATIVE

## 2015-04-02 DIAGNOSIS — H5213 Myopia, bilateral: Secondary | ICD-10-CM | POA: Diagnosis not present

## 2015-04-18 ENCOUNTER — Other Ambulatory Visit: Payer: Self-pay | Admitting: Obstetrics and Gynecology

## 2015-04-22 ENCOUNTER — Encounter (HOSPITAL_COMMUNITY): Payer: Self-pay | Admitting: *Deleted

## 2015-04-22 ENCOUNTER — Inpatient Hospital Stay (HOSPITAL_COMMUNITY)
Admission: AD | Admit: 2015-04-22 | Discharge: 2015-04-25 | DRG: 767 | Disposition: A | Discharge status: Home / Self Care | Payer: 59 | Source: Ambulatory Visit | Attending: Obstetrics and Gynecology | Admitting: Obstetrics and Gynecology

## 2015-04-22 DIAGNOSIS — O26833 Pregnancy related renal disease, third trimester: Secondary | ICD-10-CM | POA: Diagnosis present

## 2015-04-22 DIAGNOSIS — R030 Elevated blood-pressure reading, without diagnosis of hypertension: Secondary | ICD-10-CM | POA: Diagnosis present

## 2015-04-22 DIAGNOSIS — O43813 Placental infarction, third trimester: Secondary | ICD-10-CM | POA: Diagnosis not present

## 2015-04-22 DIAGNOSIS — D62 Acute posthemorrhagic anemia: Secondary | ICD-10-CM | POA: Diagnosis not present

## 2015-04-22 DIAGNOSIS — O134 Gestational [pregnancy-induced] hypertension without significant proteinuria, complicating childbirth: Secondary | ICD-10-CM | POA: Diagnosis present

## 2015-04-22 DIAGNOSIS — Z3A39 39 weeks gestation of pregnancy: Secondary | ICD-10-CM | POA: Diagnosis not present

## 2015-04-22 DIAGNOSIS — O1205 Gestational edema, complicating the puerperium: Secondary | ICD-10-CM | POA: Diagnosis present

## 2015-04-22 DIAGNOSIS — Z412 Encounter for routine and ritual male circumcision: Secondary | ICD-10-CM | POA: Diagnosis not present

## 2015-04-22 DIAGNOSIS — O9081 Anemia of the puerperium: Secondary | ICD-10-CM | POA: Diagnosis not present

## 2015-04-22 DIAGNOSIS — Z3A38 38 weeks gestation of pregnancy: Secondary | ICD-10-CM | POA: Diagnosis not present

## 2015-04-22 DIAGNOSIS — N189 Chronic kidney disease, unspecified: Secondary | ICD-10-CM | POA: Diagnosis present

## 2015-04-22 DIAGNOSIS — Z87442 Personal history of urinary calculi: Secondary | ICD-10-CM | POA: Diagnosis not present

## 2015-04-22 DIAGNOSIS — O139 Gestational [pregnancy-induced] hypertension without significant proteinuria, unspecified trimester: Secondary | ICD-10-CM | POA: Diagnosis present

## 2015-04-22 DIAGNOSIS — IMO0001 Reserved for inherently not codable concepts without codable children: Secondary | ICD-10-CM | POA: Diagnosis present

## 2015-04-22 DIAGNOSIS — Z8759 Personal history of other complications of pregnancy, childbirth and the puerperium: Secondary | ICD-10-CM

## 2015-04-22 HISTORY — DX: Reserved for inherently not codable concepts without codable children: IMO0001

## 2015-04-22 HISTORY — DX: Gestational edema, complicating the puerperium: O12.05

## 2015-04-22 HISTORY — DX: Personal history of other complications of pregnancy, childbirth and the puerperium: Z87.59

## 2015-04-22 HISTORY — DX: Gestational (pregnancy-induced) hypertension without significant proteinuria, unspecified trimester: O13.9

## 2015-04-22 HISTORY — DX: Acute posthemorrhagic anemia: D62

## 2015-04-22 LAB — COMPREHENSIVE METABOLIC PANEL
ALBUMIN: 2.9 g/dL — AB (ref 3.5–5.0)
ALK PHOS: 191 U/L — AB (ref 38–126)
ALT: 15 U/L (ref 14–54)
ANION GAP: 8 (ref 5–15)
AST: 23 U/L (ref 15–41)
BUN: 12 mg/dL (ref 6–20)
CALCIUM: 8.8 mg/dL — AB (ref 8.9–10.3)
CHLORIDE: 107 mmol/L (ref 101–111)
CO2: 22 mmol/L (ref 22–32)
CREATININE: 0.57 mg/dL (ref 0.44–1.00)
GFR calc Af Amer: >60 mL/min (ref >60)
GFR calc non Af Amer: >60 mL/min (ref >60)
GLUCOSE: 91 mg/dL (ref 65–99)
Potassium: 3.7 mmol/L (ref 3.5–5.1)
SODIUM: 137 mmol/L (ref 135–145)
Total Bilirubin: 0.5 mg/dL (ref 0.3–1.2)
Total Protein: 6.3 g/dL — ABNORMAL LOW (ref 6.5–8.1)

## 2015-04-22 LAB — CBC
HCT: 31.6 % — ABNORMAL LOW (ref 36.0–46.0)
HEMOGLOBIN: 9.8 g/dL — AB (ref 12.0–15.0)
MCH: 25.4 pg — ABNORMAL LOW (ref 26.0–34.0)
MCHC: 31 g/dL (ref 30.0–36.0)
MCV: 81.9 fL (ref 78.0–100.0)
Platelets: 227 10*3/uL (ref 150–400)
RBC: 3.86 MIL/uL — AB (ref 3.87–5.11)
RDW: 14.8 % (ref 11.5–15.5)
WBC: 12.7 10*3/uL — ABNORMAL HIGH (ref 4.0–10.5)

## 2015-04-22 LAB — PROTEIN / CREATININE RATIO, URINE
Creatinine, Urine: 101 mg/dL
Protein Creatinine Ratio: 0.6 mg/mg{Cre} — ABNORMAL HIGH (ref 0.00–0.15)
TOTAL PROTEIN, URINE: 61 mg/dL

## 2015-04-22 LAB — URIC ACID: Uric Acid, Serum: 3.1 mg/dL (ref 2.3–6.6)

## 2015-04-22 MED ORDER — OXYCODONE-ACETAMINOPHEN 5-325 MG PO TABS: 2.0000 | ORAL_TABLET | ORAL | Status: DC | PRN

## 2015-04-22 MED ORDER — ZOLPIDEM TARTRATE 5 MG PO TABS: 5.0000 mg | ORAL_TABLET | Freq: Every evening | ORAL | Status: DC | PRN

## 2015-04-22 MED ORDER — TERBUTALINE SULFATE 1 MG/ML IJ SOLN
0.2500 mg | Freq: Once | INTRAMUSCULAR | Status: DC | PRN
  Filled 2015-04-22: qty 1

## 2015-04-22 MED ORDER — BUTORPHANOL TARTRATE 2 MG/ML IJ SOLN
2.0000 mg | INTRAMUSCULAR | Status: DC | PRN
  Administered 2015-04-23: 2 mg via INTRAVENOUS
  Filled 2015-04-22 (×2): qty 2

## 2015-04-22 MED ORDER — MISOPROSTOL 25 MCG QUARTER TABLET
25.0000 ug | ORAL_TABLET | ORAL | Status: DC | PRN
  Filled 2015-04-22: qty 1

## 2015-04-22 MED ORDER — ONDANSETRON HCL 4 MG/2ML IJ SOLN: 4.0000 mg | Freq: Four times a day (QID) | INTRAMUSCULAR | Status: DC | PRN

## 2015-04-22 MED ORDER — LIDOCAINE HCL (PF) 1 % IJ SOLN
30.0000 mL | INTRAMUSCULAR | Status: DC | PRN
  Filled 2015-04-22: qty 30

## 2015-04-22 MED ORDER — OXYTOCIN 10 UNIT/ML IJ SOLN
1.0000 m[IU]/min | INTRAVENOUS | Status: DC
  Administered 2015-04-22: 2 m[IU]/min via INTRAVENOUS

## 2015-04-22 MED ORDER — OXYCODONE-ACETAMINOPHEN 5-325 MG PO TABS: 1.0000 | ORAL_TABLET | ORAL | Status: DC | PRN

## 2015-04-22 MED ORDER — ACETAMINOPHEN 325 MG PO TABS: 650.0000 mg | ORAL_TABLET | ORAL | Status: DC | PRN

## 2015-04-22 MED ORDER — LACTATED RINGERS IV SOLN
500.0000 mL | INTRAVENOUS | Status: DC | PRN
  Administered 2015-04-23: 1000 mL via INTRAVENOUS

## 2015-04-22 MED ORDER — OXYTOCIN 10 UNIT/ML IJ SOLN
2.5000 [IU]/h | INTRAVENOUS | Status: DC
  Filled 2015-04-22: qty 10

## 2015-04-22 MED ORDER — CITRIC ACID-SODIUM CITRATE 334-500 MG/5ML PO SOLN
30.0000 mL | ORAL | Status: DC | PRN
  Filled 2015-04-22: qty 15

## 2015-04-22 MED ORDER — LACTATED RINGERS IV SOLN
INTRAVENOUS | Status: DC
  Administered 2015-04-22 – 2015-04-23 (×4): via INTRAVENOUS

## 2015-04-22 MED ORDER — OXYTOCIN BOLUS FROM INFUSION: 500.0000 mL | INTRAVENOUS | Status: DC

## 2015-04-22 MED ORDER — OXYTOCIN 10 UNIT/ML IJ SOLN
10.0000 [IU] | Freq: Once | INTRAMUSCULAR | Status: AC
  Administered 2015-04-23: 10 [IU] via INTRAMUSCULAR
  Filled 2015-04-22: qty 1

## 2015-04-22 NOTE — MAU Note (Signed)
Pt c/o on not feeling well all day. Took BP about 45 mins and states that it was 160/100. Sat down, rechecked it about 20 mins ago and still ranging 160/100's. Denies HA, vision changes or RUQ pain-"just feels yucky". +FM. Has hx increased BP's with last pregnancy and this pregnancy.

## 2015-04-22 NOTE — H&P (Signed)
Claudia [name] is a 35 y.o. female presenting for complaint of elevated BP ( 160/100, 170/100) and facial and marked leg swelling. Denies h/a, visual changes. (+) FM. IVF preg.    Maternal Medical History:  Contractions: Frequency: rare.    Fetal activity: Perceived fetal activity is normal.    Prenatal complications: PIH.   Prenatal Complications - Diabetes: none.    OB History    Gravida Para Term Preterm AB TAB SAB Ectopic Multiple Living   3 1 1  0 0 0 0 0 0 1     Past Medical History  Diagnosis Date  . 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 Urinary tract infection   . Chronic kidney disease     kidney stones  . Ovarian cyst   . Pregnancy induced hypertension    Past Surgical History  Procedure Laterality Date  . Breast surgery      augmentation   Family History: family history is negative for Other. Social History:  reports that she has never smoked. She has never used smokeless tobacco. She reports that she drinks alcohol. She reports that she does not use illicit drugs.   Prenatal Transfer Tool  Maternal Diabetes: No Genetic Screening: Normal Maternal Ultrasounds/Referrals: Normal Fetal Ultrasounds or other Referrals:  None Maternal Substance Abuse:  No Significant Maternal Medications:  None Significant Maternal Lab Results:  Lab values include: Group B Strep negative Other Comments:  labile HTN. IVF preg  Review of Systems  Eyes: Negative for blurred vision.  Cardiovascular: Positive for leg swelling.  Gastrointestinal: Positive for heartburn.  Neurological: Negative for headaches.    blood pressure  Blood pressure 137/76, pulse 86, temperature 98.3 F (36.8 C), temperature source Oral, resp. rate 20, height 5' 4.5" (1.638 m), weight 80.922 kg (178 lb 6.4 oz), SpO2 100 %, unknown if currently breastfeeding. Exam Physical Exam  Constitutional: She is oriented to person, place, and time. She appears well-developed and well-nourished.  Eyes: EOM  are normal.  Neck: Neck supple.  Cardiovascular: Regular rhythm.   Respiratory: Breath sounds normal.  GI: Soft.  Musculoskeletal: She exhibits edema.  3+ edema. 1 beat clonus  Neurological: She is alert and oriented to person, place, and time. She displays abnormal reflex.    Prenatal labs: ABO, Rh:  O positive Antibody:  neg Rubella:  Immune RPR:   NR HBsAg:  neg  HIV:   NR GBS:   neg  CBC    Component Value Date/Time   WBC 12.7* 04/22/2015 2117   RBC 3.86* 04/22/2015 2117   HGB 9.8* 04/22/2015 2117   HCT 31.6* 04/22/2015 2117   PLT 227 04/22/2015 2117   MCV 81.9 04/22/2015 2117   MCH 25.4* 04/22/2015 2117   MCHC 31.0 04/22/2015 2117   RDW 14.8 04/22/2015 2117   CMP     Component Value Date/Time   NA 137 04/22/2015 2117   K 3.7 04/22/2015 2117   CL 107 04/22/2015 2117   CO2 22 04/22/2015 2117   GLUCOSE 91 04/22/2015 2117   BUN 12 04/22/2015 2117   CREATININE 0.57 04/22/2015 2117   CALCIUM 8.8* 04/22/2015 2117   PROT 6.3* 04/22/2015 2117   ALBUMIN 2.9* 04/22/2015 2117   AST 23 04/22/2015 2117   ALT 15 04/22/2015 2117   ALKPHOS 191* 04/22/2015 2117   BILITOT 0.5 04/22/2015 2117   GFRNONAA >60 04/22/2015 2117   GFRAA >60 04/22/2015 2117      Assessment/Plan: Labile BP with elevated protein/creatinine  ratio IUP@ 38 weeks Peripheral edema P) admit. Routine labs. Labetalol IV prn. Intracervical balloon. Pitocin IV. Analgesic/epidural prn   Floreine Kingdon A 04/22/2015, 10:37 PM

## 2015-04-23 ENCOUNTER — Inpatient Hospital Stay (HOSPITAL_COMMUNITY): Payer: 59 | Admitting: Anesthesiology

## 2015-04-23 ENCOUNTER — Encounter (HOSPITAL_COMMUNITY): Payer: Self-pay | Admitting: Anesthesiology

## 2015-04-23 LAB — CBC
HCT: 33.4 % — ABNORMAL LOW (ref 36.0–46.0)
HCT: 22.5 % — ABNORMAL LOW (ref 36.0–46.0)
HEMATOCRIT: 20.3 % — AB (ref 36.0–46.0)
HEMOGLOBIN: 10.5 g/dL — AB (ref 12.0–15.0)
HEMOGLOBIN: 6.7 g/dL — AB (ref 12.0–15.0)
Hemoglobin: 7.2 g/dL — ABNORMAL LOW (ref 12.0–15.0)
MCH: 25.6 pg — ABNORMAL LOW (ref 26.0–34.0)
MCH: 26.4 pg (ref 26.0–34.0)
MCH: 26.9 pg (ref 26.0–34.0)
MCHC: 31.4 g/dL (ref 30.0–36.0)
MCHC: 32 g/dL (ref 30.0–36.0)
MCHC: 33 g/dL (ref 30.0–36.0)
MCV: 81.5 fL (ref 78.0–100.0)
MCV: 82.4 fL (ref 78.0–100.0)
MCV: 81.5 fL (ref 78.0–100.0)
PLATELETS: 227 10*3/uL (ref 150–400)
PLATELETS: 164 10*3/uL (ref 150–400)
Platelets: 152 10*3/uL (ref 150–400)
RBC: 4.1 MIL/uL (ref 3.87–5.11)
RBC: 2.73 MIL/uL — AB (ref 3.87–5.11)
RBC: 2.49 MIL/uL — AB (ref 3.87–5.11)
RDW: 14.6 % (ref 11.5–15.5)
RDW: 14.9 % (ref 11.5–15.5)
RDW: 14.9 % (ref 11.5–15.5)
WBC: 13.7 10*3/uL — ABNORMAL HIGH (ref 4.0–10.5)
WBC: 16.4 10*3/uL — AB (ref 4.0–10.5)
WBC: 15.9 10*3/uL — ABNORMAL HIGH (ref 4.0–10.5)

## 2015-04-23 LAB — RPR: RPR Ser Ql: NONREACTIVE

## 2015-04-23 LAB — COMPREHENSIVE METABOLIC PANEL
ALBUMIN: 2.7 g/dL — AB (ref 3.5–5.0)
ALK PHOS: 187 U/L — AB (ref 38–126)
ALT: 16 U/L (ref 14–54)
ANION GAP: 5 (ref 5–15)
AST: 23 U/L (ref 15–41)
BUN: 9 mg/dL (ref 6–20)
CALCIUM: 8.6 mg/dL — AB (ref 8.9–10.3)
CHLORIDE: 113 mmol/L — AB (ref 101–111)
CO2: 20 mmol/L — AB (ref 22–32)
Creatinine, Ser: 0.59 mg/dL (ref 0.44–1.00)
GFR calc Af Amer: >60 mL/min (ref >60)
GFR calc non Af Amer: >60 mL/min (ref >60)
GLUCOSE: 73 mg/dL (ref 65–99)
POTASSIUM: 4.1 mmol/L (ref 3.5–5.1)
SODIUM: 138 mmol/L (ref 135–145)
Total Bilirubin: 0.5 mg/dL (ref 0.3–1.2)
Total Protein: 6 g/dL — ABNORMAL LOW (ref 6.5–8.1)

## 2015-04-23 LAB — URIC ACID: Uric Acid, Serum: 3.8 mg/dL (ref 2.3–6.6)

## 2015-04-23 LAB — TYPE AND SCREEN
ABO/RH(D): O POS
ANTIBODY SCREEN: NEGATIVE

## 2015-04-23 MED ORDER — LIDOCAINE HCL (PF) 1 % IJ SOLN
INTRAMUSCULAR | Status: DC | PRN
  Administered 2015-04-23 (×2): 7 mL via EPIDURAL

## 2015-04-23 MED ORDER — FENTANYL CITRATE (PF) 100 MCG/2ML IJ SOLN: 100.0000 ug | INTRAMUSCULAR | Status: DC | PRN

## 2015-04-23 MED ORDER — EPHEDRINE 5 MG/ML INJ
10.0000 mg | INTRAVENOUS | Status: DC | PRN
  Filled 2015-04-23: qty 2

## 2015-04-23 MED ORDER — ONDANSETRON HCL 4 MG PO TABS: 4.0000 mg | ORAL_TABLET | ORAL | Status: DC | PRN

## 2015-04-23 MED ORDER — ONDANSETRON HCL 4 MG/2ML IJ SOLN
4.0000 mg | INTRAMUSCULAR | Status: DC | PRN
Start: 2015-04-23 — End: 2015-04-25

## 2015-04-23 MED ORDER — SODIUM CHLORIDE 0.9 % IV SOLN
3.0000 g | Freq: Four times a day (QID) | INTRAVENOUS | Status: AC
  Administered 2015-04-23 – 2015-04-24 (×3): 3 g via INTRAVENOUS
  Filled 2015-04-23 (×3): qty 3

## 2015-04-23 MED ORDER — FERROUS SULFATE 325 (65 FE) MG PO TABS: 325.0000 mg | ORAL_TABLET | Freq: Two times a day (BID) | ORAL | Status: DC

## 2015-04-23 MED ORDER — POLYSACCHARIDE IRON COMPLEX 150 MG PO CAPS
150.0000 mg | ORAL_CAPSULE | Freq: Every day | ORAL | Status: DC
  Administered 2015-04-23 – 2015-04-25 (×3): 150 mg via ORAL
  Filled 2015-04-23 (×3): qty 1

## 2015-04-23 MED ORDER — LACTATED RINGERS IV SOLN: 500.0000 mL | Freq: Once | INTRAVENOUS | Status: DC

## 2015-04-23 MED ORDER — PRENATAL MULTIVITAMIN CH
1.0000 | ORAL_TABLET | Freq: Every day | ORAL | Status: DC
  Administered 2015-04-24: 1 via ORAL
  Filled 2015-04-23 (×2): qty 1

## 2015-04-23 MED ORDER — FENTANYL 2.5 MCG/ML BUPIVACAINE 1/10 % EPIDURAL INFUSION (WH - ANES)
14.0000 mL/h | INTRAMUSCULAR | Status: DC | PRN
  Administered 2015-04-23: 14 mL/h via EPIDURAL
  Filled 2015-04-23: qty 125

## 2015-04-23 MED ORDER — LACTATED RINGERS IV SOLN
INTRAVENOUS | Status: DC
  Administered 2015-04-23: 09:00:00 via INTRAUTERINE

## 2015-04-23 MED ORDER — PHENYLEPHRINE 40 MCG/ML (10ML) SYRINGE FOR IV PUSH (FOR BLOOD PRESSURE SUPPORT)
80.0000 ug | PREFILLED_SYRINGE | INTRAVENOUS | Status: DC | PRN
  Filled 2015-04-23: qty 2

## 2015-04-23 MED ORDER — FENTANYL CITRATE (PF) 100 MCG/2ML IJ SOLN
INTRAMUSCULAR | Status: AC
  Administered 2015-04-23: 50 ug
  Filled 2015-04-23: qty 2

## 2015-04-23 MED ORDER — WITCH HAZEL-GLYCERIN EX PADS: 1.0000 "application " | MEDICATED_PAD | CUTANEOUS | Status: DC | PRN

## 2015-04-23 MED ORDER — SIMETHICONE 80 MG PO CHEW
80.0000 mg | CHEWABLE_TABLET | ORAL | Status: DC | PRN
Start: 2015-04-23 — End: 2015-04-25

## 2015-04-23 MED ORDER — BENZOCAINE-MENTHOL 20-0.5 % EX AERO
1.0000 "application " | INHALATION_SPRAY | CUTANEOUS | Status: DC | PRN
  Filled 2015-04-23: qty 56

## 2015-04-23 MED ORDER — IBUPROFEN 600 MG PO TABS
600.0000 mg | ORAL_TABLET | Freq: Four times a day (QID) | ORAL | Status: DC
  Administered 2015-04-23 – 2015-04-25 (×7): 600 mg via ORAL
  Filled 2015-04-23 (×8): qty 1

## 2015-04-23 MED ORDER — LANOLIN HYDROUS EX OINT: TOPICAL_OINTMENT | CUTANEOUS | Status: DC | PRN

## 2015-04-23 MED ORDER — DIBUCAINE 1 % RE OINT: 1.0000 "application " | TOPICAL_OINTMENT | RECTAL | Status: DC | PRN

## 2015-04-23 MED ORDER — ZOLPIDEM TARTRATE 5 MG PO TABS: 5.0000 mg | ORAL_TABLET | Freq: Every evening | ORAL | Status: DC | PRN

## 2015-04-23 MED ORDER — DIPHENHYDRAMINE HCL 50 MG/ML IJ SOLN: 12.5000 mg | INTRAMUSCULAR | Status: DC | PRN

## 2015-04-23 MED ORDER — AMPICILLIN-SULBACTAM SODIUM 3 (2-1) G IJ SOLR
3.0000 g | Freq: Four times a day (QID) | INTRAMUSCULAR | Status: DC
  Administered 2015-04-23: 3 g via INTRAVENOUS
  Filled 2015-04-23 (×3): qty 3

## 2015-04-23 MED ORDER — SENNOSIDES-DOCUSATE SODIUM 8.6-50 MG PO TABS
2.0000 | ORAL_TABLET | ORAL | Status: DC
  Administered 2015-04-24: 2 via ORAL
  Filled 2015-04-23 (×2): qty 2

## 2015-04-23 MED ORDER — DIPHENHYDRAMINE HCL 25 MG PO CAPS: 25.0000 mg | ORAL_CAPSULE | Freq: Four times a day (QID) | ORAL | Status: DC | PRN

## 2015-04-23 MED ORDER — NITROGLYCERIN 0.4 MG/SPRAY TL SOLN
Status: AC
  Filled 2015-04-23: qty 4.9

## 2015-04-23 NOTE — Progress Notes (Signed)
S:  Called earlier for  Return  Of repeat variable decelerations Per RN , pt was 5-6 cm dilated . Now 9 cm dilated  O: Pitocin off Tracing reviewed. Baseline 140 (+) deep variables with ctx down to 80's -90 x 50-60 secs. Min variability. Maternal O2 VE: small anterior lip noted. Reduced with ctx Pushed effectively . vtx down to +2 station Disc with pt and husband need for assistance with vacuum Due to continued deep variable deceleration Pedi called for vacuum delivery IMP: complete Deep variable deceleration due to cord compression P) anticipate vaginal delivery. OR on standby. Pedi called

## 2015-04-23 NOTE — Progress Notes (Signed)
Dr Arby BarretteHatchett and Dr Cherly Hensenousins both made aware of time of last CBC and reason for admission related to increased blood pressures. Order for CBC through lab, however, plan to proceed with epidural placement prior to results of CBC.

## 2015-04-23 NOTE — Progress Notes (Signed)
[facility] and variability, and interventions provided including position changes, amnioinfusion, IV bolus, and Dcd pitocin. Provider aware.

## 2015-04-23 NOTE — Anesthesia Postprocedure Evaluation (Signed)
Anesthesia Post Note  Patient: Claudia [name]  Procedure(s) Performed: * No procedures listed *  Patient location during evaluation: Mother Baby Anesthesia Type: Epidural Level of consciousness: awake and alert Pain management: pain level controlled Vital Signs Assessment: post-procedure vital signs reviewed and stable Respiratory status: spontaneous breathing Cardiovascular status: stable Postop Assessment: no headache, epidural receding, no backache, adequate PO intake, patient able to bend at knees and no signs of nausea or vomiting Anesthetic complications: no    Last Vitals:  Filed Vitals:   04/23/15 1455 04/23/15 1606  BP: 139/74 143/78  Pulse: 95 97  Temp: 37.6 C 37.3 C  Resp: 18     Last Pain:  Filed Vitals:   04/23/15 1610  PainSc: 0-No pain                 Lavena Loretto, Weldon Pickingharlesetta Marie

## 2015-04-23 NOTE — Progress Notes (Signed)
Delivery of live viable female by Dr Cherly Hensenousins. APGARS 8,9

## 2015-04-23 NOTE — Anesthesia Preprocedure Evaluation (Signed)
Anesthesia Evaluation  Patient identified by MRN, date of birth, ID band Patient awake    Reviewed: Allergy & Precautions, H&P , NPO status , Patient's Chart, lab work & pertinent test results  Airway Mallampati: I  TM Distance: >3 FB Neck ROM: full    Dental no notable dental hx.    Pulmonary neg pulmonary ROS,    Pulmonary exam normal        Cardiovascular hypertension, negative cardio ROS Normal cardiovascular exam     Neuro/Psych negative neurological ROS  negative psych ROS   GI/Hepatic negative GI ROS, Neg liver ROS,   Endo/Other  negative endocrine ROS  Renal/GU      Musculoskeletal   Abdominal Normal abdominal exam  (+)   Peds  Hematology negative hematology ROS (+)   Anesthesia Other Findings   Reproductive/Obstetrics (+) Pregnancy                             Anesthesia Physical Anesthesia Plan  ASA: II  Anesthesia Plan: Epidural   Post-op Pain Management:    Induction:   Airway Management Planned:   Additional Equipment:   Intra-op Plan:   Post-operative Plan:   Informed Consent: I have reviewed the patients History and Physical, chart, labs and discussed the procedure including the risks, benefits and alternatives for the proposed anesthesia with the patient or authorized representative who has indicated his/her understanding and acceptance.     Plan Discussed with:   Anesthesia Plan Comments:         Anesthesia Quick Evaluation

## 2015-04-23 NOTE — Progress Notes (Signed)
Intracervical balloon placed using speculum Tracing reactive cat 1 VE 1/70/-2

## 2015-04-23 NOTE — Progress Notes (Signed)
Claudia [name] is a 35 y.o. G3P1001 at Unknown by ultrasound admitted for induction of labor due to Hypertension.  Subjective: No chief complaint on file. epidural Pitocin   Objective: BP 136/68 mmHg  Pulse 83  Temp(Src) 98.5 F (36.9 C) (Oral)  Resp 16  Ht 5' 4.5" (1.638 m)  Wt 80.922 kg (178 lb 6.4 oz)  BMI 30.16 kg/m2  SpO2 100%     FHT:  FHR: 140 bpm, variability: minimal ,  accelerations:  Abscent,  decelerations:  Present variable UC:   regular, every 3 minutes SVE:   Loose 4 cm dilated, 80% effaced, -2 station   Labs: Lab Results  Component Value Date   WBC 13.7* 04/23/2015   HGB 10.5* 04/23/2015   HCT 33.4* 04/23/2015   MCV 81.5 04/23/2015   PLT 227 04/23/2015    Assessment / Plan: PIH  IVF preg Term Variable deceleration 2nd to cord compression P) amnioinfusion. Cont with exaggerated sims position. Watch tracing closely. Cont pitocin   Anticipated MOD:  NSVD  Claudia [name] A 04/23/2015, 9:41 AM

## 2015-04-23 NOTE — Anesthesia Procedure Notes (Signed)
Epidural Patient location during procedure: OB Start time: 04/23/2015 8:36 AM End time: 04/23/2015 8:40 AM  Staffing Anesthesiologist: Leilani AbleHATCHETT, Darnisha Vernet Performed by: anesthesiologist   Preanesthetic Checklist Completed: patient identified, surgical consent, pre-op evaluation, timeout performed, IV checked, risks and benefits discussed and monitors and equipment checked  Epidural Patient position: sitting Prep: site prepped and draped and DuraPrep Patient monitoring: continuous pulse ox and blood pressure Approach: midline Location: L3-L4 Injection technique: LOR air  Needle:  Needle type: Tuohy  Needle gauge: 17 G Needle length: 9 cm and 9 Needle insertion depth: 5 cm cm Catheter type: closed end flexible Catheter size: 19 Gauge Catheter at skin depth: 10 cm Test dose: negative and Other  Assessment Sensory level: T9 Events: blood not aspirated, injection not painful, no injection resistance, negative IV test and no paresthesia  Additional Notes Reason for block:procedure for pain

## 2015-04-23 NOTE — Progress Notes (Signed)
S: (+) ctx.  Denies h/a, visual change Frequent void  Pitocin 14 miu O: BP 152/82 mmHg  Pulse 95  Temp(Src) 99.2 F (37.3 C) (Oral)  Resp 18  Ht 5' 4.5" (1.638 m)  Wt 80.922 kg (178 lb 6.4 oz)  BMI 30.16 kg/m2  SpO2 100% VE: balloon in vagina. Removed 4/80/-2 ROT. arom  Clear fluid IUPC , ISE placed  Tracing: baseline 150 decreased variability Some small variables Ctx q 3-4 mins IMP: Active phase PIH P) Epidural. Left exaggerated sims position. Repeat PIH  Labs this am

## 2015-04-23 NOTE — Lactation Note (Signed)
This note was copied from a baby's chart. Lactation Consultation Note  Patient Name: Boy Virgel Gessriel Stickler Today's Date: 04/23/2015 Reason for consult: Initial assessment Baby at 7 hr of life and mom reports the 1 bf went well. Her older son did not latch so she pumped for 4 months. She would like to continue latching this baby. She is a [employer], [employer] employee that completed healthy pregnancy. She would like the get a DEBP before going home. Denies breast or nipple pain, no concerns voiced. Stated she can manually express and has a spoon in the room. Discussed baby behavior, feeding frequency, voids, wt loss, breast changes, and nipple care. Given lactation handouts. Aware of OP services and support group.    Maternal Data Has patient been taught Hand Expression?: Yes Does the patient have breastfeeding experience prior to this delivery?: Yes  Feeding    LATCH Score/Interventions                      Lactation Tools Discussed/Used WIC Program: No   Consult Status Consult Status: Follow-up Date: 04/24/15 Follow-up type: In-patient    Rulon Eisenmengerlizabeth E Dohn Stclair 04/23/2015, 7:36 PM

## 2015-04-24 ENCOUNTER — Encounter (HOSPITAL_COMMUNITY): Payer: Self-pay | Admitting: Obstetrics and Gynecology

## 2015-04-24 DIAGNOSIS — IMO0001 Reserved for inherently not codable concepts without codable children: Secondary | ICD-10-CM

## 2015-04-24 DIAGNOSIS — D62 Acute posthemorrhagic anemia: Secondary | ICD-10-CM | POA: Diagnosis not present

## 2015-04-24 DIAGNOSIS — O1205 Gestational edema, complicating the puerperium: Secondary | ICD-10-CM

## 2015-04-24 DIAGNOSIS — Z8759 Personal history of other complications of pregnancy, childbirth and the puerperium: Secondary | ICD-10-CM

## 2015-04-24 HISTORY — DX: Acute posthemorrhagic anemia: D62

## 2015-04-24 HISTORY — DX: Personal history of other complications of pregnancy, childbirth and the puerperium: Z87.59

## 2015-04-24 HISTORY — DX: Gestational edema, complicating the puerperium: O12.05

## 2015-04-24 HISTORY — DX: Reserved for inherently not codable concepts without codable children: IMO0001

## 2015-04-24 LAB — CBC
HCT: 19.8 % — ABNORMAL LOW (ref 36.0–46.0)
Hemoglobin: 6.4 g/dL — CL (ref 12.0–15.0)
MCH: 26.4 pg (ref 26.0–34.0)
MCHC: 32.3 g/dL (ref 30.0–36.0)
MCV: 81.8 fL (ref 78.0–100.0)
PLATELETS: 154 10*3/uL (ref 150–400)
RBC: 2.42 MIL/uL — AB (ref 3.87–5.11)
RDW: 15.2 % (ref 11.5–15.5)
WBC: 13.1 10*3/uL — ABNORMAL HIGH (ref 4.0–10.5)

## 2015-04-24 MED ORDER — SODIUM CHLORIDE 0.9 % IV SOLN
510.0000 mg | Freq: Once | INTRAVENOUS | Status: AC
  Administered 2015-04-24: 510 mg via INTRAVENOUS
  Filled 2015-04-24: qty 17

## 2015-04-24 MED ORDER — HYDROCHLOROTHIAZIDE 12.5 MG PO CAPS
12.5000 mg | ORAL_CAPSULE | Freq: Every day | ORAL | Status: DC
  Administered 2015-04-24 – 2015-04-25 (×2): 12.5 mg via ORAL
  Filled 2015-04-24 (×2): qty 1

## 2015-04-24 NOTE — Progress Notes (Signed)
Patient ID: Claudia [name], female   DOB: [birth year], 35 y.o.   MRN: [id number] PPD # 1 SVD / Manual Removal of Placenta  S:  Reports feeling very tired.             Tolerating po/ No nausea or vomiting             Bleeding is light             Pain controlled with ibuprofen (OTC)             Up ad lib / ambulatory / voiding without difficulties    Newborn  Information for the patient's newborn:  Claudia [name], Boy [name] [[id number]]  female   breast feeding  / Circumcision planning   O:  A & O x 3, in no apparent distress              VS:  Filed Vitals:   04/23/15 1606 04/23/15 1822 04/23/15 2020 04/24/15 0525  BP: 143/78 140/81 145/81 144/80  Pulse: 97 97 100 84  Temp: 99.2 F (37.3 C) 99.3 F (37.4 C) 99.7 F (37.6 C) 97.9 F (36.6 C)  TempSrc: Oral Oral Oral Oral  Resp:  18 18 20   Height:      Weight:      SpO2: 99%       LABS:   Recent Labs  04/23/15 2025 04/24/15 0632  WBC 15.9* 13.1*  HGB 6.7* 6.4*  HCT 20.3* 19.8*  PLT 152 154    Blood type: O POS (03/21 2117)  Rubella: Immune (09/13 0000)   I&O: I/O last 3 completed shifts: In: -  Out: 2179 [Urine:1200; Blood:979]             Lungs: Clear and unlabored  Heart: regular rate and rhythm / no murmurs  Abdomen: soft, non-tender, non-distended             Fundus: firm, non-tender, U-1  Perineum: 2nd degree perineal repair healing well  Lochia: minimal  Extremities: 1+ edema, no calf pain or tenderness, No Homans    A/P: PPD # 1  35 y.o., [id number]   Principal Problem:    Postpartum care following vaginal delivery with manual placenta removal (3/22)  Active Problems:    PIH (pregnancy induced hypertension)    Retained placenta or amniotic membrane after delivery without hemorrhage    Maternal iron deficiency anemia    Acute blood loss anemia   Gestational Edema, postpartum     Doing well - stable status  Routine post partum orders  Feraheme 510 mg Infusion (per Dr. Nelta Numbersousins's order)  HCTZ 12.5 mg po  daily  Discharge tomorrow dependent on postpartum status    Claudia [name], M, MSN, CNM 04/24/2015, 8:18 AM

## 2015-04-24 NOTE — Progress Notes (Signed)
Pt seen post delivery events hgb 7.2 Pre-delivery/admit  hgb 9.8 Pt notes dizzy spell on first gotten up Foley in place draining copious clear urine Prev emptied 300cc urine  Pt had not eaten as yet IMP: IDA compounded by ABL. Disc poss need for blood transfusion and its risk Given pre-level hgb,, will check cbc @ 8:30 pm, orthostatic VS And make determination regarding transfusion at that time, Foley remains. Agree with plan

## 2015-04-24 NOTE — Progress Notes (Signed)
Spoke with pt @ 7:45 am regarding recent CBC result Had not been symptomatic w/r to anemia. Pt prefers not to be transfused. Agree with IV iron therapy and concomitant oral iron supplementation.  Will also start low dose hctz for leg edema and borderline BP

## 2015-04-24 NOTE — Progress Notes (Signed)
Spoke with pt via phone Regarding repeat hgb 6.7 Orthostatic values reviewed with  RN and pt Pt denies h/a, SOB, CP/palpitation Did not note pulse until ambulated, Pt would prefer to defer transfusion until Repeat labs in am  advised to do OOB with assist.  D/c foley Watch vital signs

## 2015-04-25 MED ORDER — HYDROCHLOROTHIAZIDE 12.5 MG PO CAPS: 12.5000 mg | ORAL_CAPSULE | Freq: Every day | ORAL | Status: DC

## 2015-04-25 MED ORDER — IBUPROFEN 600 MG PO TABS: 600.0000 mg | ORAL_TABLET | Freq: Four times a day (QID) | ORAL | Status: DC

## 2015-04-25 MED ORDER — POLYSACCHARIDE IRON COMPLEX 150 MG PO CAPS: 150.0000 mg | ORAL_CAPSULE | Freq: Two times a day (BID) | ORAL | Status: DC

## 2015-04-25 MED ORDER — MAGNESIUM OXIDE 400 (241.3 MG) MG PO TABS
400.0000 mg | ORAL_TABLET | Freq: Every day | ORAL | Status: DC
  Administered 2015-04-25: 400 mg via ORAL
  Filled 2015-04-25: qty 1

## 2015-04-25 MED ORDER — MAGNESIUM OXIDE 400 (241.3 MG) MG PO TABS: 400.0000 mg | ORAL_TABLET | Freq: Every day | ORAL | Status: DC

## 2015-04-25 MED FILL — IBUPROFEN 600 MG TABLET: 600 | 8 days supply | Qty: 30 | Fill #0

## 2015-04-25 MED FILL — HYDROCHLOROTHIAZIDE 12.5 MG: 12.5 | 7 days supply | Qty: 7 | Fill #0

## 2015-04-25 MED FILL — POLY-IRON 150 MG CAPSULE: 150 | 56 days supply | Qty: 60 | Fill #0

## 2015-04-25 NOTE — Discharge Summary (Signed)
Obstetric Discharge Summary  Reason for Admission: induction of labor 2nd to labile HTN/PIH Prenatal Procedures: none Intrapartum Procedures: vacuum, uterine exploration, curettage and manual removal of placenta, epidrual Postpartum Procedures: curettage and antibiotics with retained/manual placenta removal Complications-Operative and Postpartum: 2nd degree perineal laceration and ABL anemia-hemodynamically stable(patient declination of transfusion) HEMOGLOBIN  Date Value Ref Range Status  04/24/2015 6.4* 12.0 - 15.0 g/dL Final    Comment:    REPEATED TO VERIFY CRITICAL RESULT CALLED TO, READ BACK BY AND VERIFIED WITH: BYRD,J. @0707  ON 04/24/2015 BY BOVELL,A.    HCT  Date Value Ref Range Status  04/24/2015 19.8* 36.0 - 46.0 % Final    Physical Exam:  General: alert, cooperative and no distress Lochia: appropriate Uterine Fundus: firm Incision: healing well DVT Evaluation: No evidence of DVT seen on physical exam.  Discharge Diagnoses: Term Pregnancy-delivered and ABL anemia Retained placenta, Anemia compounded by ABL   Discharge Information: Date: 04/25/2015 Activity: pelvic rest Diet: routine Medications: PNV, Ibuprofen, Colace, Iron and magnesium Condition: stable Instructions: refer to practice specific booklet Discharge to: home Follow-up Information    Follow up with Kadar Chance A, MD. Schedule an appointment as soon as possible for a visit in 1 week.   Specialty:  Obstetrics and Gynecology   Contact information:   [address] [place] [id number] [phone]       Newborn Data: Live born female  Birth Weight: 6 lb 5.1 oz (2865 g) APGAR: 8, 9  Home with mother.  Marlinda MikeBAILEY, TANYA 04/25/2015, 11:54 AM

## 2015-04-25 NOTE — Progress Notes (Signed)
PPD 2 SVD with manual placenta removal  Patient in shower with initial rounds - returned after surgical case to rounds & discharge  S:  Reports feeling well             Tolerating po/ No nausea or vomiting             Bleeding is light             Pain controlled withmotrin             Up ad lib / ambulatory / voiding QS  Newborn breast feeding   O:     VS: BP 138/68 mmHg  Pulse 75  Temp(Src) 98 F (36.7 C) (Oral)  Resp 20  Ht 5' 4.5" (1.638 m)  Wt 80.922 kg (178 lb 6.4 oz)  BMI 30.16 kg/m2  SpO2 99%  LMP 07/23/2014 (Exact Date)  Breastfeeding? Unknown   LABS:              Recent Labs  04/23/15 2025 04/24/15 0632  WBC 15.9* 13.1*  HGB 6.7* 6.4*  PLT 152 154               Blood type: --/--/O POS (03/21 2117)  Rubella: Immune (09/13 0000)                                 Physical Exam:             Alert and oriented X3  Lungs: Clear and unlabored  Heart: regular rate and rhythm / no mumurs  Abdomen: soft, non-tender, non-distended              Fundus: firm, non-tender, U-1  Perineum: no edema  Lochia: light  Extremities: 1+ edema, no calf pain or tenderness    A: PPD # 2 SVD with manual placenta removal             IDA of pregnancy with ABL anemia             Dependent edema with labile hypertension  Doing well - stable status  P: Routine post partum orders  DC home             Iron therapy x 6 weeks             HCTZ x 1 week - office visit 1 week to recheck status              Marlinda MikeBAILEY, Rylah Fukuda CNM, MSN, [facility] 04/25/2015, 7:52 AM

## 2015-04-29 ENCOUNTER — Inpatient Hospital Stay (HOSPITAL_COMMUNITY): Admission: RE | Admit: 2015-04-29 | Payer: 59 | Source: Ambulatory Visit

## 2015-06-06 DIAGNOSIS — R87612 Low grade squamous intraepithelial lesion on cytologic smear of cervix (LGSIL): Secondary | ICD-10-CM | POA: Diagnosis not present

## 2015-06-06 DIAGNOSIS — Z1151 Encounter for screening for human papillomavirus (HPV): Secondary | ICD-10-CM | POA: Diagnosis not present

## 2015-06-06 DIAGNOSIS — Z130 Encounter for screening for diseases of the blood and blood-forming organs and certain disorders involving the immune mechanism: Secondary | ICD-10-CM | POA: Diagnosis not present

## 2015-06-11 MED FILL — DASETTA 1-35-28 TABLET: 1-35 | 84 days supply | Qty: 112 | Fill #0

## 2015-07-17 DIAGNOSIS — N870 Mild cervical dysplasia: Secondary | ICD-10-CM | POA: Diagnosis not present

## 2015-07-17 DIAGNOSIS — R87612 Low grade squamous intraepithelial lesion on cytologic smear of cervix (LGSIL): Secondary | ICD-10-CM | POA: Diagnosis not present

## 2015-09-09 MED FILL — DASETTA 1-35-28 TABLET: 1-35 | 84 days supply | Qty: 112 | Fill #1

## 2015-12-09 MED FILL — DASETTA 1-35-28 TABLET: 1-35 | 84 days supply | Qty: 112 | Fill #2

## 2016-03-22 MED FILL — DASETTA 1-35-28 TABLET: 1-35 | 63 days supply | Qty: 84 | Fill #0

## 2016-05-24 DIAGNOSIS — H5213 Myopia, bilateral: Secondary | ICD-10-CM | POA: Diagnosis not present

## 2016-06-07 DIAGNOSIS — N390 Urinary tract infection, site not specified: Secondary | ICD-10-CM | POA: Diagnosis not present

## 2016-07-29 DIAGNOSIS — B303 Acute epidemic hemorrhagic conjunctivitis (enteroviral): Secondary | ICD-10-CM | POA: Diagnosis not present

## 2017-02-06 DIAGNOSIS — N390 Urinary tract infection, site not specified: Secondary | ICD-10-CM | POA: Diagnosis not present

## 2017-08-30 DIAGNOSIS — H5213 Myopia, bilateral: Secondary | ICD-10-CM | POA: Diagnosis not present

## 2017-12-21 DIAGNOSIS — Z01419 Encounter for gynecological examination (general) (routine) without abnormal findings: Secondary | ICD-10-CM | POA: Diagnosis not present

## 2017-12-21 DIAGNOSIS — N870 Mild cervical dysplasia: Secondary | ICD-10-CM | POA: Diagnosis not present

## 2017-12-21 DIAGNOSIS — Z1151 Encounter for screening for human papillomavirus (HPV): Secondary | ICD-10-CM | POA: Diagnosis not present

## 2017-12-21 DIAGNOSIS — Z6820 Body mass index (BMI) 20.0-20.9, adult: Secondary | ICD-10-CM | POA: Diagnosis not present

## 2018-06-19 DIAGNOSIS — Z03818 Encounter for observation for suspected exposure to other biological agents ruled out: Secondary | ICD-10-CM | POA: Diagnosis not present

## 2018-10-10 DIAGNOSIS — H5213 Myopia, bilateral: Secondary | ICD-10-CM | POA: Diagnosis not present

## 2019-01-30 DIAGNOSIS — D509 Iron deficiency anemia, unspecified: Secondary | ICD-10-CM | POA: Diagnosis not present

## 2019-03-08 DIAGNOSIS — Z03818 Encounter for observation for suspected exposure to other biological agents ruled out: Secondary | ICD-10-CM | POA: Diagnosis not present

## 2019-04-13 ENCOUNTER — Telehealth: Payer: 59 | Admitting: Family

## 2019-04-13 DIAGNOSIS — R399 Unspecified symptoms and signs involving the genitourinary system: Secondary | ICD-10-CM | POA: Diagnosis not present

## 2019-04-13 MED ORDER — CEPHALEXIN 500 MG PO CAPS: 500.0000 mg | ORAL_CAPSULE | Freq: Two times a day (BID) | ORAL | 0 refills | Status: DC

## 2019-04-13 NOTE — Addendum Note (Signed)
Addended by: Jannifer Rodney A on: 04/13/2019 02:23 PM   Modules accepted: Orders

## 2019-04-13 NOTE — Progress Notes (Signed)

## 2019-04-16 DIAGNOSIS — N3020 Other chronic cystitis without hematuria: Secondary | ICD-10-CM | POA: Diagnosis not present

## 2019-04-16 DIAGNOSIS — R350 Frequency of micturition: Secondary | ICD-10-CM | POA: Diagnosis not present

## 2019-04-16 DIAGNOSIS — R3915 Urgency of urination: Secondary | ICD-10-CM | POA: Diagnosis not present

## 2019-05-10 ENCOUNTER — Other Ambulatory Visit (HOSPITAL_COMMUNITY): Payer: Self-pay | Admitting: Urology

## 2019-05-28 MED FILL — SOLIFENACIN SUCCINATE 10 MG: 10 | 90 days supply | Qty: 90 | Fill #0

## 2019-06-18 ENCOUNTER — Telehealth: Payer: 59 | Admitting: Nurse Practitioner

## 2019-06-18 DIAGNOSIS — R399 Unspecified symptoms and signs involving the genitourinary system: Secondary | ICD-10-CM

## 2019-06-18 MED ORDER — NITROFURANTOIN MONOHYD MACRO 100 MG PO CAPS: 100.0000 mg | ORAL_CAPSULE | Freq: Two times a day (BID) | ORAL | 0 refills | Status: DC

## 2019-06-18 NOTE — Progress Notes (Signed)

## 2019-08-27 MED FILL — SOLIFENACIN SUCCINATE 10 MG: 10 | 90 days supply | Qty: 90 | Fill #1

## 2019-11-23 MED FILL — SOLIFENACIN SUCCINATE 10 MG: 10 | 90 days supply | Qty: 90 | Fill #2

## 2019-12-03 ENCOUNTER — Other Ambulatory Visit: Payer: Self-pay | Admitting: Nurse Practitioner

## 2019-12-03 ENCOUNTER — Telehealth: Payer: 59 | Admitting: Nurse Practitioner

## 2019-12-03 DIAGNOSIS — R399 Unspecified symptoms and signs involving the genitourinary system: Secondary | ICD-10-CM | POA: Diagnosis not present

## 2019-12-03 MED ORDER — CEPHALEXIN 500 MG PO CAPS: 500.0000 mg | ORAL_CAPSULE | Freq: Two times a day (BID) | ORAL | 0 refills | Status: DC

## 2019-12-03 MED FILL — CEPHALEXIN 500 MG CAPSULE: 500 | 7 days supply | Qty: 14 | Fill #0

## 2019-12-03 NOTE — Progress Notes (Signed)

## 2019-12-11 DIAGNOSIS — N3020 Other chronic cystitis without hematuria: Secondary | ICD-10-CM | POA: Diagnosis not present

## 2019-12-20 DIAGNOSIS — H524 Presbyopia: Secondary | ICD-10-CM | POA: Diagnosis not present

## 2020-02-05 DIAGNOSIS — Z01419 Encounter for gynecological examination (general) (routine) without abnormal findings: Secondary | ICD-10-CM | POA: Diagnosis not present

## 2020-02-19 MED FILL — SOLIFENACIN SUCCINATE 10 MG: 10 | 90 days supply | Qty: 90 | Fill #3

## 2020-05-21 ENCOUNTER — Other Ambulatory Visit (HOSPITAL_COMMUNITY): Payer: Self-pay

## 2020-05-21 MED ORDER — SOLIFENACIN SUCCINATE 10 MG PO TABS
10.0000 mg | ORAL_TABLET | Freq: Every day | ORAL | 3 refills | Status: DC
  Filled 2020-05-21: qty 90, 90d supply, fill #0
  Filled 2020-09-01: qty 90, 90d supply, fill #1
  Filled 2021-01-01: qty 90, 90d supply, fill #2
  Filled 2021-04-02: qty 90, 90d supply, fill #3

## 2020-05-21 MED ORDER — MYRBETRIQ 50 MG PO TB24
50.0000 mg | ORAL_TABLET | Freq: Every day | ORAL | 5 refills | Status: DC
  Filled 2020-05-21: qty 30, 30d supply, fill #0

## 2020-09-01 ENCOUNTER — Other Ambulatory Visit (HOSPITAL_COMMUNITY): Payer: Self-pay

## 2020-09-11 DIAGNOSIS — N921 Excessive and frequent menstruation with irregular cycle: Secondary | ICD-10-CM | POA: Diagnosis not present

## 2020-09-11 DIAGNOSIS — N930 Postcoital and contact bleeding: Secondary | ICD-10-CM | POA: Diagnosis not present

## 2020-12-30 DIAGNOSIS — H5213 Myopia, bilateral: Secondary | ICD-10-CM | POA: Diagnosis not present

## 2021-01-01 ENCOUNTER — Other Ambulatory Visit (HOSPITAL_COMMUNITY): Payer: Self-pay

## 2021-02-05 DIAGNOSIS — Z01419 Encounter for gynecological examination (general) (routine) without abnormal findings: Secondary | ICD-10-CM | POA: Diagnosis not present

## 2021-02-05 DIAGNOSIS — N923 Ovulation bleeding: Secondary | ICD-10-CM | POA: Diagnosis not present

## 2021-04-02 ENCOUNTER — Other Ambulatory Visit (HOSPITAL_COMMUNITY): Payer: Self-pay

## 2021-04-08 ENCOUNTER — Other Ambulatory Visit (HOSPITAL_COMMUNITY): Payer: Self-pay

## 2021-12-30 DIAGNOSIS — H5213 Myopia, bilateral: Secondary | ICD-10-CM | POA: Diagnosis not present

## 2022-01-25 ENCOUNTER — Telehealth: Payer: 59 | Admitting: Physician Assistant

## 2022-01-25 DIAGNOSIS — J208 Acute bronchitis due to other specified organisms: Secondary | ICD-10-CM

## 2022-01-26 MED ORDER — BENZONATATE 100 MG PO CAPS: 100.0000 mg | ORAL_CAPSULE | Freq: Three times a day (TID) | ORAL | 0 refills | Status: DC | PRN

## 2022-01-26 MED ORDER — PREDNISONE 20 MG PO TABS: 40.0000 mg | ORAL_TABLET | Freq: Every day | ORAL | 0 refills | Status: DC

## 2022-01-26 NOTE — Progress Notes (Signed)
We are sorry that you are not feeling well.  Here is how we plan to help!  Based on your presentation I believe you most likely have A cough due to a virus.  This is called viral bronchitis and is best treated by rest, plenty of fluids and control of the cough.  You may use Ibuprofen or Tylenol as directed to help your symptoms.     In addition you may use A prescription cough medication called Tessalon Perles 100mg . You may take 1-2 capsules every 8 hours as needed for your cough.  I have also sent in a short course of prednisone to reduce inflammation and calm spasm in upper airways.   From your responses in the eVisit questionnaire you describe inflammation in the upper respiratory tract which is causing a significant cough.  This is commonly called Bronchitis and has four common causes:   Allergies Viral Infections Acid Reflux Bacterial Infection Allergies, viruses and acid reflux are treated by controlling symptoms or eliminating the cause. An example might be a cough caused by taking certain blood pressure medications. You stop the cough by changing the medication. Another example might be a cough caused by acid reflux. Controlling the reflux helps control the cough.  USE OF BRONCHODILATOR ("RESCUE") INHALERS: There is a risk from using your bronchodilator too frequently.  The risk is that over-reliance on a medication which only relaxes the muscles surrounding the breathing tubes can reduce the effectiveness of medications prescribed to reduce swelling and congestion of the tubes themselves.  Although you feel brief relief from the bronchodilator inhaler, your asthma may actually be worsening with the tubes becoming more swollen and filled with mucus.  This can delay other crucial treatments, such as oral steroid medications. If you need to use a bronchodilator inhaler daily, several times per day, you should discuss this with your provider.  There are probably better treatments that could be  used to keep your asthma under control.     HOME CARE Only take medications as instructed by your medical team. Complete the entire course of an antibiotic. Drink plenty of fluids and get plenty of rest. Avoid close contacts especially the very young and the elderly Cover your mouth if you cough or cough into your sleeve. Always remember to wash your hands A steam or ultrasonic humidifier can help congestion.   GET HELP RIGHT AWAY IF: You develop worsening fever. You become short of breath You cough up blood. Your symptoms persist after you have completed your treatment plan MAKE SURE YOU  Understand these instructions. Will watch your condition. Will get help right away if you are not doing well or get worse.    Thank you for choosing an e-visit.  Your e-visit answers were reviewed by a board certified advanced clinical practitioner to complete your personal care plan. Depending upon the condition, your plan could have included both over the counter or prescription medications.  Please review your pharmacy choice. Make sure the pharmacy is open so you can pick up prescription now. If there is a problem, you may contact your provider through and have the prescription routed to another pharmacy.  Your safety is important to [employer]. If you have drug allergies check your prescription carefully.   For the next 24 hours you can use MyChart to ask questions about today's visit, request a non-urgent call back, or ask for a work or school excuse. You will get an email in the next two days asking about  your experience. I hope that your e-visit has been valuable and will speed your recovery.

## 2022-01-26 NOTE — Progress Notes (Signed)
I have spent 5 minutes in review of e-visit questionnaire, review and updating patient chart, medical decision making and response to patient.   Draven Natter Cody Solace Wendorff, PA-C    

## 2022-03-02 ENCOUNTER — Telehealth: Payer: 59 | Admitting: Nurse Practitioner

## 2022-03-02 DIAGNOSIS — R197 Diarrhea, unspecified: Secondary | ICD-10-CM

## 2022-03-02 DIAGNOSIS — R110 Nausea: Secondary | ICD-10-CM

## 2022-03-02 MED ORDER — ONDANSETRON HCL 4 MG PO TABS: 4.0000 mg | ORAL_TABLET | Freq: Three times a day (TID) | ORAL | 0 refills | Status: DC | PRN

## 2022-03-02 NOTE — Progress Notes (Signed)
We are sorry that you are not feeling well.  Here is how we plan to help!  Based on what you have shared with me it looks like you have Acute Infectious Diarrhea.  Most cases of acute diarrhea are due to infections with virus and bacteria and are self-limited conditions lasting less than 14 days.  For your symptoms you may take Imodium 2 mg tablets that are over the counter at your local pharmacy. Take two tablet now and then one after each loose stool up to 6 a day.  Antibiotics are not needed for most people with diarrhea.  Optional: Zofran 4 mg 1 tablet every 8 hours as needed for nausea and vomiting   HOME CARE We recommend changing your diet to help with your symptoms for the next few days. Eat bland foods (cereal, toast, rice or noodles) avoid spicy greasy and fried foods Drink plenty of fluids that contain water salt and sugar. Sports drinks such as Gatorade may help.  You may try broths, soups, bananas, applesauce, soft breads, mashed potatoes or crackers.  You are considered infectious for as long as the diarrhea continues. Hand washing or use of alcohol based hand sanitizers is recommend. It is best to stay out of work or school until your symptoms stop.   GET HELP RIGHT AWAY If you have dark yellow colored urine or do not pass urine frequently you should drink more fluids.   If your symptoms worsen  If you feel like you are going to pass out (faint) You have a new problem  MAKE SURE YOU  Understand these instructions. Will watch your condition. Will get help right away if you are not doing well or get worse.  Thank you for choosing an e-visit.  Your e-visit answers were reviewed by a board certified advanced clinical practitioner to complete your personal care plan. Depending upon the condition, your plan could have included both over the counter or prescription medications.  Please review your pharmacy choice. Make sure the pharmacy is open so you can pick up  prescription now. If there is a problem, you may contact your provider through [employer] and have the prescription routed to another pharmacy.  Your safety is important to [place]. If you have drug allergies check your prescription carefully.   For the next 24 hours you can use MyChart to ask questions about today's visit, request a non-urgent call back, or ask for a work or school excuse. You will get an email in the next two days asking about your experience. I hope that your e-visit has been valuable and will speed your recovery.  Meds ordered this encounter  Medications   ondansetron (ZOFRAN) 4 MG tablet    Sig: Take 1 tablet (4 mg total) by mouth every 8 (eight) hours as needed for nausea or vomiting.    Dispense:  20 tablet    Refill:  0     I spent approximately 5 minutes reviewing the patient's history, current symptoms and coordinating their care today.

## 2022-03-10 DIAGNOSIS — N923 Ovulation bleeding: Secondary | ICD-10-CM | POA: Diagnosis not present

## 2022-03-10 DIAGNOSIS — Z01419 Encounter for gynecological examination (general) (routine) without abnormal findings: Secondary | ICD-10-CM | POA: Diagnosis not present

## 2022-03-31 DIAGNOSIS — N923 Ovulation bleeding: Secondary | ICD-10-CM | POA: Diagnosis not present

## 2022-05-24 ENCOUNTER — Telehealth: Payer: 59 | Admitting: Nurse Practitioner

## 2022-05-24 DIAGNOSIS — N3000 Acute cystitis without hematuria: Secondary | ICD-10-CM | POA: Diagnosis not present

## 2022-05-24 MED ORDER — CEPHALEXIN 500 MG PO CAPS: 500.0000 mg | ORAL_CAPSULE | Freq: Two times a day (BID) | ORAL | 0 refills | Status: AC

## 2022-05-24 NOTE — Progress Notes (Signed)

## 2022-06-02 DIAGNOSIS — N921 Excessive and frequent menstruation with irregular cycle: Secondary | ICD-10-CM | POA: Diagnosis not present

## 2022-06-22 ENCOUNTER — Other Ambulatory Visit: Payer: Self-pay | Admitting: Obstetrics and Gynecology

## 2022-06-23 ENCOUNTER — Encounter (HOSPITAL_BASED_OUTPATIENT_CLINIC_OR_DEPARTMENT_OTHER): Payer: Self-pay | Admitting: Obstetrics and Gynecology

## 2022-06-23 NOTE — Progress Notes (Signed)
Spoke w/ via phone for pre-op interview---  pt Lab needs dos----  cbc, urine preg             Lab results------  no COVID test -----patient states asymptomatic no test needed Arrive at ------- 0530 on 06-29-2022 NPO after MN NO Solid Food.  Clear liquids from MN until--- 0430 Med rec completed Medications to take morning of surgery ----- none Diabetic medication ----- n/a Patient instructed no nail polish to be worn day of surgery Patient instructed to bring photo id and insurance card day of surgery Patient aware to have Driver (ride ) / caregiver    for 24 hours after surgery -- husband, Claudia [name] Patient Special Instructions ----- n/a Pre-Op special Instructions ----- n/a Patient verbalized understanding of instructions that were given at this phone interview. Patient denies shortness of breath, chest pain, fever, cough at this phone interview.

## 2022-06-29 ENCOUNTER — Other Ambulatory Visit: Payer: Self-pay

## 2022-06-29 ENCOUNTER — Ambulatory Visit (HOSPITAL_BASED_OUTPATIENT_CLINIC_OR_DEPARTMENT_OTHER): Payer: 59 | Admitting: Anesthesiology

## 2022-06-29 ENCOUNTER — Encounter (HOSPITAL_BASED_OUTPATIENT_CLINIC_OR_DEPARTMENT_OTHER): Payer: Self-pay | Admitting: Obstetrics and Gynecology

## 2022-06-29 ENCOUNTER — Encounter (HOSPITAL_BASED_OUTPATIENT_CLINIC_OR_DEPARTMENT_OTHER)
Admission: RE | Disposition: A | Discharge status: Home / Self Care | Payer: Self-pay | Source: Home / Self Care | Attending: Obstetrics and Gynecology

## 2022-06-29 ENCOUNTER — Ambulatory Visit (HOSPITAL_BASED_OUTPATIENT_CLINIC_OR_DEPARTMENT_OTHER)
Admission: RE | Admit: 2022-06-29 | Discharge: 2022-06-29 | Disposition: A | Discharge status: Home / Self Care | Payer: 59 | Attending: Obstetrics and Gynecology | Admitting: Obstetrics and Gynecology

## 2022-06-29 DIAGNOSIS — N840 Polyp of corpus uteri: Secondary | ICD-10-CM | POA: Diagnosis not present

## 2022-06-29 DIAGNOSIS — N923 Ovulation bleeding: Secondary | ICD-10-CM | POA: Diagnosis not present

## 2022-06-29 DIAGNOSIS — D649 Anemia, unspecified: Secondary | ICD-10-CM

## 2022-06-29 DIAGNOSIS — Z01818 Encounter for other preprocedural examination: Secondary | ICD-10-CM

## 2022-06-29 HISTORY — DX: Personal history of other complications of pregnancy, childbirth and the puerperium: Z87.59

## 2022-06-29 HISTORY — DX: Other chronic cystitis without hematuria: N30.20

## 2022-06-29 HISTORY — DX: Personal history of diseases of the blood and blood-forming organs and certain disorders involving the immune mechanism: Z86.2

## 2022-06-29 HISTORY — DX: Ovulation bleeding: N92.3

## 2022-06-29 HISTORY — DX: Presence of spectacles and contact lenses: Z97.3

## 2022-06-29 HISTORY — DX: Personal history of other diseases of the female genital tract: Z87.42

## 2022-06-29 HISTORY — PX: DILATATION & CURETTAGE/HYSTEROSCOPY WITH MYOSURE: SHX6511

## 2022-06-29 HISTORY — DX: Personal history of urinary calculi: Z87.442

## 2022-06-29 LAB — POCT I-STAT, CHEM 8
BUN: 14 mg/dL (ref 6–20)
Calcium, Ion: 1.28 mmol/L (ref 1.15–1.40)
Chloride: 105 mmol/L (ref 98–111)
Creatinine, Ser: 0.7 mg/dL (ref 0.44–1.00)
Glucose, Bld: 88 mg/dL (ref 70–99)
HCT: 42 % (ref 36.0–46.0)
Hemoglobin: 14.3 g/dL (ref 12.0–15.0)
Potassium: 4.2 mmol/L (ref 3.5–5.1)
Sodium: 142 mmol/L (ref 135–145)
TCO2: 26 mmol/L (ref 22–32)

## 2022-06-29 LAB — POCT PREGNANCY, URINE: Preg Test, Ur: NEGATIVE

## 2022-06-29 SURGERY — DILATATION & CURETTAGE/HYSTEROSCOPY WITH MYOSURE
Anesthesia: General | Site: Vagina

## 2022-06-29 MED ORDER — FENTANYL CITRATE (PF) 250 MCG/5ML IJ SOLN
INTRAMUSCULAR | Status: DC | PRN
  Administered 2022-06-29: 50 ug via INTRAVENOUS

## 2022-06-29 MED ORDER — ONDANSETRON HCL 4 MG/2ML IJ SOLN
INTRAMUSCULAR | Status: DC | PRN
  Administered 2022-06-29: 4 mg via INTRAVENOUS

## 2022-06-29 MED ORDER — PROPOFOL 10 MG/ML IV BOLUS
INTRAVENOUS | Status: DC | PRN
  Administered 2022-06-29: 150 mg via INTRAVENOUS

## 2022-06-29 MED ORDER — ACETAMINOPHEN 500 MG PO TABS
ORAL_TABLET | ORAL | Status: AC
  Filled 2022-06-29: qty 2

## 2022-06-29 MED ORDER — MIDAZOLAM HCL 2 MG/2ML IJ SOLN
INTRAMUSCULAR | Status: DC | PRN
  Administered 2022-06-29: 2 mg via INTRAVENOUS

## 2022-06-29 MED ORDER — ACETAMINOPHEN 500 MG PO TABS
1000.0000 mg | ORAL_TABLET | Freq: Once | ORAL | Status: AC
  Administered 2022-06-29: 1000 mg via ORAL

## 2022-06-29 MED ORDER — POVIDONE-IODINE 10 % EX SWAB: 2.0000 | Freq: Once | CUTANEOUS | Status: DC

## 2022-06-29 MED ORDER — DEXAMETHASONE SODIUM PHOSPHATE 10 MG/ML IJ SOLN
INTRAMUSCULAR | Status: DC | PRN
  Administered 2022-06-29: 10 mg via INTRAVENOUS

## 2022-06-29 MED ORDER — FENTANYL CITRATE (PF) 100 MCG/2ML IJ SOLN
INTRAMUSCULAR | Status: AC
  Filled 2022-06-29: qty 2

## 2022-06-29 MED ORDER — KETOROLAC TROMETHAMINE 30 MG/ML IJ SOLN
INTRAMUSCULAR | Status: DC | PRN
  Administered 2022-06-29: 30 mg via INTRAVENOUS

## 2022-06-29 MED ORDER — LACTATED RINGERS IV SOLN: INTRAVENOUS | Status: DC

## 2022-06-29 MED ORDER — PROPOFOL 10 MG/ML IV BOLUS
INTRAVENOUS | Status: AC
  Filled 2022-06-29: qty 20

## 2022-06-29 MED ORDER — FENTANYL CITRATE (PF) 100 MCG/2ML IJ SOLN: 25.0000 ug | INTRAMUSCULAR | Status: DC | PRN

## 2022-06-29 MED ORDER — PROPOFOL 500 MG/50ML IV EMUL
INTRAVENOUS | Status: DC | PRN
  Administered 2022-06-29: 200 ug/kg/min via INTRAVENOUS

## 2022-06-29 MED ORDER — MIDAZOLAM HCL 2 MG/2ML IJ SOLN
INTRAMUSCULAR | Status: AC
  Filled 2022-06-29: qty 2

## 2022-06-29 MED ORDER — SODIUM CHLORIDE 0.9 % IR SOLN
Status: DC | PRN
  Administered 2022-06-29: 1500 mL

## 2022-06-29 MED ORDER — LIDOCAINE 2% (20 MG/ML) 5 ML SYRINGE
INTRAMUSCULAR | Status: DC | PRN
  Administered 2022-06-29: 40 mg via INTRAVENOUS

## 2022-06-29 SURGICAL SUPPLY — 22 items
CATH ROBINSON RED A/P 16FR (CATHETERS) IMPLANT
DEVICE MYOSURE LITE (MISCELLANEOUS) IMPLANT
DEVICE MYOSURE REACH (MISCELLANEOUS) IMPLANT
DILATOR CANAL MILEX (MISCELLANEOUS) IMPLANT
DRSG TELFA 3X8 NADH STRL (GAUZE/BANDAGES/DRESSINGS) ×1 IMPLANT
GAUZE 4X4 16PLY ~~LOC~~+RFID DBL (SPONGE) ×1 IMPLANT
GLOVE BIOGEL PI IND STRL 7.0 (GLOVE) ×1 IMPLANT
GLOVE ECLIPSE 6.5 STRL STRAW (GLOVE) ×1 IMPLANT
GOWN STRL REUS W/TWL LRG LVL3 (GOWN DISPOSABLE) ×1 IMPLANT
IV NS IRRIG 3000ML ARTHROMATIC (IV SOLUTION) ×1 IMPLANT
KIT PROCEDURE FLUENT (KITS) ×1 IMPLANT
KIT TURNOVER CYSTO (KITS) ×1 IMPLANT
MYOSURE XL FIBROID (MISCELLANEOUS)
PACK VAGINAL MINOR WOMEN LF (CUSTOM PROCEDURE TRAY) ×1 IMPLANT
PAD OB MATERNITY 4.3X12.25 (PERSONAL CARE ITEMS) ×1 IMPLANT
PAD PREP 24X48 CUFFED NSTRL (MISCELLANEOUS) ×1 IMPLANT
SEAL CERVICAL OMNI LOK (ABLATOR) IMPLANT
SEAL ROD LENS SCOPE MYOSURE (ABLATOR) ×1 IMPLANT
SLEEVE SCD COMPRESS KNEE MED (STOCKING) ×1 IMPLANT
SYSTEM TISS REMOVAL MYOSURE XL (MISCELLANEOUS) IMPLANT
TOWEL OR 17X24 6PK STRL BLUE (TOWEL DISPOSABLE) ×1 IMPLANT
WATER STERILE IRR 500ML POUR (IV SOLUTION) ×1 IMPLANT

## 2022-06-29 NOTE — Brief Op Note (Signed)
06/29/2022  8:20 AM  PATIENT:  Claudia [name]  42 y.o. female  PRE-OPERATIVE DIAGNOSIS:  intermenstrual bleeding  POST-OPERATIVE DIAGNOSIS:  intermenstrual bleeding, endometrial polyps  PROCEDURE:  diagnostic hysteroscopy, hysteroscopic resection of endometrial polyps using Myosure, dilation and curettage  SURGEON:  Surgeon(s) and Role:    * Maxie Better, MD - Primary  PHYSICIAN ASSISTANT:   ASSISTANTS: none   ANESTHESIA:   general  EBL:  5 mL   BLOOD ADMINISTERED:none  DRAINS: none   LOCAL MEDICATIONS USED:  NONE  SPECIMEN:  Source of Specimen:  EMC with polyp  DISPOSITION OF SPECIMEN:  PATHOLOGY  COUNTS:  YES  TOURNIQUET:  * No tourniquets in log *  DICTATION: .Other Dictation: Dictation Number [id number]  PLAN OF CARE: Discharge to home after PACU  PATIENT DISPOSITION:  PACU - hemodynamically stable.   Delay start of Pharmacological VTE agent (>24hrs) due to surgical blood loss or risk of bleeding: no

## 2022-06-29 NOTE — Interval H&P Note (Signed)
History and Physical Interval Note:  06/29/2022 7:12 AM  Claudia [name]  has presented today for surgery, with the diagnosis of intermenstrual bleeding.  The various methods of treatment have been discussed with the patient and family. After consideration of risks, benefits and other options for treatment, the patient has consented to  Procedure(s): DILATATION & CURETTAGE/HYSTEROSCOPY WITH MYOSURE (N/A) as a surgical intervention.  The patient's history has been reviewed, patient examined, no change in status, stable for surgery.  I have reviewed the patient's chart and labs.  Questions were answered to the patient's satisfaction.     Javeria Briski A Kyler Germer

## 2022-06-29 NOTE — Anesthesia Postprocedure Evaluation (Signed)
Anesthesia Post Note  Patient: Claudia [name]  Procedure(s) Performed: DILATATION & CURETTAGE/HYSTEROSCOPY WITH MYOSURE (Vagina )     Patient location during evaluation: PACU Anesthesia Type: General Level of consciousness: awake and alert Pain management: pain level controlled Vital Signs Assessment: post-procedure vital signs reviewed and stable Respiratory status: spontaneous breathing, nonlabored ventilation, respiratory function stable and patient connected to nasal cannula oxygen Cardiovascular status: blood pressure returned to baseline and stable Postop Assessment: no apparent nausea or vomiting Anesthetic complications: no  No notable events documented.  Last Vitals:  Vitals:   06/29/22 0845 06/29/22 0912  BP: 126/69 133/73  Pulse:  (!) 45  Resp:  16  Temp: 36.6 C   SpO2:  100%    Last Pain:  Vitals:   06/29/22 0912  TempSrc:   PainSc: 0-No pain                 Nasiah Lehenbauer L Belky Mundo

## 2022-06-29 NOTE — Anesthesia Preprocedure Evaluation (Addendum)
Anesthesia Evaluation  Patient identified by MRN, date of birth, ID band Patient awake    Reviewed: Allergy & Precautions, NPO status , Patient's Chart, lab work & pertinent test results  Airway Mallampati: II  TM Distance: >3 FB Neck ROM: Full    Dental no notable dental hx.    Pulmonary neg pulmonary ROS   Pulmonary exam normal breath sounds clear to auscultation       Cardiovascular negative cardio ROS Normal cardiovascular exam Rhythm:Regular Rate:Normal     Neuro/Psych negative neurological ROS  negative psych ROS   GI/Hepatic negative GI ROS, Neg liver ROS,,,  Endo/Other  negative endocrine ROS    Renal/GU negative Renal ROS  negative genitourinary   Musculoskeletal negative musculoskeletal ROS (+)    Abdominal   Peds  Hematology  (+) Blood dyscrasia, anemia   Anesthesia Other Findings   Reproductive/Obstetrics                             Anesthesia Physical Anesthesia Plan  ASA: 2  Anesthesia Plan: General   Post-op Pain Management: Ofirmev IV (intra-op)*   Induction: Intravenous  PONV Risk Score and Plan: 3 and Ondansetron, Dexamethasone, Midazolam and TIVA  Airway Management Planned: LMA  Additional Equipment:   Intra-op Plan:   Post-operative Plan: Extubation in OR  Informed Consent: I have reviewed the patients History and Physical, chart, labs and discussed the procedure including the risks, benefits and alternatives for the proposed anesthesia with the patient or authorized representative who has indicated his/her understanding and acceptance.     Dental advisory given  Plan Discussed with: CRNA  Anesthesia Plan Comments:        Anesthesia Quick Evaluation

## 2022-06-29 NOTE — Anesthesia Procedure Notes (Signed)
Procedure Name: LMA Insertion Date/Time: 06/29/2022 7:43 AM  Performed by: Dairl Ponder, CRNAPre-anesthesia Checklist: Patient identified, Emergency Drugs available, Suction available and Patient being monitored Patient Re-evaluated:Patient Re-evaluated prior to induction Oxygen Delivery Method: Circle System Utilized Preoxygenation: Pre-oxygenation with 100% oxygen Induction Type: IV induction Ventilation: Mask ventilation without difficulty LMA: LMA inserted LMA Size: 3.0 Number of attempts: 1 Airway Equipment and Method: Bite block Placement Confirmation: positive ETCO2 Tube secured with: Tape Dental Injury: Teeth and Oropharynx as per pre-operative assessment

## 2022-06-29 NOTE — Op Note (Signed)
Claudia [name], [place] MEDICAL RECORD NO: [id number] ACCOUNT NO: [id number] DATE OF BIRTH: [birth year] FACILITY: [facility] LOCATION: [facility] PHYSICIAN: Kenry Daubert A. Cherly Hensen, MD  Operative Report   DATE OF PROCEDURE: 06/29/2022  PREOPERATIVE DIAGNOSIS:  Intermenstrual bleeding.  PROCEDURE:  Diagnostic hysteroscopy, hysteroscopic resection of endometrial polyps using MyoSure, dilation and curettage.  POSTOPERATIVE DIAGNOSES:  Intermenstrual bleeding, endometrial polyps.  ANESTHESIA:  General.  SURGEON:  Rykar Lebleu A. Cherly Hensen, MD  ASSISTANT:  None.  DESCRIPTION OF PROCEDURE:  Under adequate general anesthesia, the patient was placed in the dorsal lithotomy position.  She was sterilely prepped and draped in the usual fashion.  The patient had not been catheterized due to voiding prior to entering the  room.  Examination under anesthesia revealed a retroflexed uterus.  No adnexal masses could be appreciated.  Bivalve speculum was placed in the vagina.  A single-tooth tenaculum was placed on the anterior lip of the cervix.  A wide ectropion was noted.   Cervix was dilated up to [facility] dilator.  A diagnostic hysteroscope was introduced into the uterine cavity.  Polypoid lesions were noted in the posterior wall of the endometrial cavity.  Endometrial thickening throughout was noted.  Tubal ostia were  not initially seen.  Using the Reach resectoscope the endometrial cavity and polyps were resected.  When all tissue was felt to be removed, endocervical canal was inspected, no lesions were noted and all instruments were then removed from the vagina.   Specimen labeled endometrial curetting with polyps was sent to pathology.  ESTIMATED BLOOD LOSS:  2 mL Fluid deficit: 150 ml COMPLICATIONS:  None.  DISPOSITION:  The patient tolerated the procedure well and was transferred to recovery room in stable condition.   VAI D: 06/29/2022 8:25:52 am T: 06/29/2022 8:31:00 am  JOB: [id number]/  [id number]

## 2022-06-29 NOTE — Transfer of Care (Signed)
Immediate Anesthesia Transfer of Care Note  Patient: Claudia [name]  Procedure(s) Performed: DILATATION & CURETTAGE/HYSTEROSCOPY WITH MYOSURE (Vagina )  Patient Location: PACU  Anesthesia Type:General  Level of Consciousness: awake, alert , and oriented  Airway & Oxygen Therapy: Patient Spontanous Breathing  Post-op Assessment: Report given to RN and Post -op Vital signs reviewed and stable  Post vital signs: Reviewed and stable  Last Vitals:  Vitals Value Taken Time  BP 121/72 06/29/22 0812  Temp    Pulse 68 06/29/22 0814  Resp 12 06/29/22 0814  SpO2 100 % 06/29/22 0814  Vitals shown include unvalidated device data.  Last Pain:  Vitals:   06/29/22 0626  TempSrc: Oral  PainSc: 2       Patients Stated Pain Goal: 4 (06/29/22 8295)  Complications: No notable events documented.

## 2022-06-29 NOTE — Discharge Instructions (Addendum)
CALL  IF TEMP>100.4, NOTHING PER VAGINA X 1 WK, CALL IF SOAKING A MAXI  PAD EVERY HOUR OR MORE FREQUENTLY    D & C Home care Instructions:   Personal hygiene:  Used sanitary napkins for vaginal drainage not tampons. Shower or tub bathe the day after your procedure. No douching until bleeding stops. Always wipe from front to back after  Elimination.  Activity: Do not drive or operate any equipment today. The effects of the anesthesia are still present and drowsiness may result. Rest today, not necessarily flat bed rest, just take it easy. You may resume your normal activity in one to 2 days.  Sexual activity: No intercourse for one week or as indicated by your physician  Diet: Eat a light diet as desired this evening. You may resume a regular diet tomorrow.  General Expectations of your surgery: Vaginal bleeding should be no heavier than a normal period. Spotting may continue up to 10 days. Mild cramps may continue for a couple of days. You may have a regular period in 2-6 weeks.  Unexpected observations call your doctor if these occur: persistent or heavy bleeding. Severe abdominal cramping or pain. Elevation of temperature greater than 100   You were given Tylenol prior to your surgical procedure today, please do not take any Tylenol until after 1pm today

## 2022-06-29 NOTE — H&P (Signed)
Claudia [name] is an 42 y.o. female. G2P2 MF presents for surgical evaluation of IMB. Pt is scheduled for dx hysteroscopy, D&C, hysteroscopic resection. Ebx has been benign. Sono did not show any lesion and pap smear nl  Pertinent Gynecological History: Menses: regular every month with spotting approximately 2-3 days per month Bleeding: intermenstrual bleeding Contraception: none DES exposure: denies Blood transfusions: none Sexually transmitted diseases: no past history Previous GYN Procedures:  n/a   Last mammogram: normal Date: 2023 Last pap: normal Date: 2024 OB History: G2, P2   Menstrual History: Menarche age: n/a Patient's last menstrual period was 06/11/2022 (exact date).    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 She has never used smokeless tobacco. She reports current alcohol use of about 3.0 - 4.0 standard drinks of alcohol per week. She reports that she does not use drugs.  Allergies: No Known Allergies  Medications Prior to Admission  Medication Sig Dispense Refill Last Dose   Probiotic Product (PROBIOTIC/PREBIOTIC/CRANBERRY) CAPS Take 1 capsule by mouth daily.       Review of Systems  All other systems reviewed and are negative.   Height 5\' 5"  (1.651 m), weight 58.1 kg, last menstrual period 06/11/2022, unknown if currently breastfeeding. Physical Exam Constitutional:      Appearance: Normal  appearance.  HENT:     Head: Atraumatic.  Eyes:     Extraocular Movements: Extraocular movements intact.  Cardiovascular:     Heart sounds: Normal heart sounds.  Pulmonary:     Breath sounds: Normal breath sounds.  Abdominal:     Palpations: Abdomen is soft.  Genitourinary:    General: Normal vulva.     Comments: Vagina nl Cervix parous Uterus RF/RV Adnexa nl Musculoskeletal:        General: Normal range of motion.     Cervical back: Neck supple.  Skin:    General: Skin is warm and dry.  Neurological:     General: No focal deficit present.     Mental Status: She is alert and oriented to person, place, and time.  Psychiatric:        Mood and Affect: Mood normal.        Behavior: Behavior normal.     No results found for this or any previous visit (from the past 24 hour(s)).  No results found.  Assessment/Plan: IMB P) dx hysteroscopy, D&C. Procedure explained. Risk of surgery reviewed including infection, bleeding, injury to surrounding organ structures, thermal injury, fluid overload and its mgmt, uterine perforation and its risk. All >? answered  Dameisha Tschida A Taraya Steward 06/29/2022, 5:36 AM

## 2022-06-30 ENCOUNTER — Encounter (HOSPITAL_BASED_OUTPATIENT_CLINIC_OR_DEPARTMENT_OTHER): Payer: Self-pay | Admitting: Obstetrics and Gynecology

## 2022-06-30 LAB — SURGICAL PATHOLOGY

## 2022-09-27 DIAGNOSIS — R3915 Urgency of urination: Secondary | ICD-10-CM | POA: Diagnosis not present

## 2022-09-27 DIAGNOSIS — N3020 Other chronic cystitis without hematuria: Secondary | ICD-10-CM | POA: Diagnosis not present

## 2022-09-27 DIAGNOSIS — R350 Frequency of micturition: Secondary | ICD-10-CM | POA: Diagnosis not present

## 2022-11-08 ENCOUNTER — Telehealth: Payer: 59 | Admitting: Physician Assistant

## 2022-11-08 DIAGNOSIS — B360 Pityriasis versicolor: Secondary | ICD-10-CM

## 2022-11-08 MED ORDER — KETOCONAZOLE 2 % EX SHAM
MEDICATED_SHAMPOO | CUTANEOUS | 0 refills | Status: DC
Start: 2022-11-08 — End: 2023-09-13

## 2022-11-08 NOTE — Progress Notes (Signed)
E Visit for Rash  We are sorry that you are not feeling well. Here is how we plan to help!  Based upon your presentation it appears you have a fungal infection.  I have prescribed: Ketoconazole shampoo Apply on body like a body wash and leave in place for 5 minutes nightly for 2 weeks, then once weekly to prevent recurrence. Can then switch to Selsun blue and use once weekly as a body wash to prevent recurrence.  HOME CARE:  Take cool showers and avoid direct sunlight. Apply cool compress or wet dressings. Take a bath in an oatmeal bath.  Sprinkle content of one Aveeno packet under running faucet with comfortably warm water.  Bathe for 15-20 minutes, 1-2 times daily.  Pat dry with a towel. Do not rub the rash. Use hydrocortisone cream. Take an antihistamine like Benadryl for widespread rashes that itch.  The adult dose of Benadryl is 25-50 mg by mouth 4 times daily. Caution:  This type of medication may cause sleepiness.  Do not drink alcohol, drive, or operate dangerous machinery while taking antihistamines.  Do not take these medications if you have prostate enlargement.  Read package instructions thoroughly on all medications that you take.  GET HELP RIGHT AWAY IF:  Symptoms don't go away after treatment. Severe itching that persists. If you rash spreads or swells. If you rash begins to smell. If it blisters and opens or develops a yellow-brown crust. You develop a fever. You have a sore throat. You become short of breath.  MAKE SURE YOU:  Understand these instructions. Will watch your condition. Will get help right away if you are not doing well or get worse.  Thank you for choosing an e-visit.  Your e-visit answers were reviewed by a board certified advanced clinical practitioner to complete your personal care plan. Depending upon the condition, your plan could have included both over the counter or prescription medications.  Please review your pharmacy choice. Make sure the  pharmacy is open so you can pick up prescription now. If there is a problem, you may contact your provider through [employer] and have the prescription routed to another pharmacy.  Your safety is important to [place]. If you have drug allergies check your prescription carefully.   For the next 24 hours you can use MyChart to ask questions about today's visit, request a non-urgent call back, or ask for a work or school excuse. You will get an email in the next two days asking about your experience. I hope that your e-visit has been valuable and will speed your recovery.  I have spent 5 minutes in review of e-visit questionnaire, review and updating patient chart, medical decision making and response to patient.   Margaretann Loveless, PA-C

## 2022-11-17 DIAGNOSIS — Z1231 Encounter for screening mammogram for malignant neoplasm of breast: Secondary | ICD-10-CM | POA: Diagnosis not present

## 2023-05-24 ENCOUNTER — Telehealth: Admitting: Physician Assistant

## 2023-05-24 DIAGNOSIS — R3989 Other symptoms and signs involving the genitourinary system: Secondary | ICD-10-CM

## 2023-05-24 MED ORDER — CEPHALEXIN 500 MG PO CAPS: 500.0000 mg | ORAL_CAPSULE | Freq: Two times a day (BID) | ORAL | 0 refills | Status: AC

## 2023-05-24 NOTE — Progress Notes (Signed)

## 2023-05-24 NOTE — Progress Notes (Signed)
 I have spent 5 minutes in review of e-visit questionnaire, review and updating patient chart, medical decision making and response to patient.   Piedad Climes, PA-C

## 2023-08-13 ENCOUNTER — Telehealth: Admitting: Nurse Practitioner

## 2023-08-13 DIAGNOSIS — H6990 Unspecified Eustachian tube disorder, unspecified ear: Secondary | ICD-10-CM | POA: Diagnosis not present

## 2023-08-13 MED ORDER — IPRATROPIUM BROMIDE 0.03 % NA SOLN
2.0000 | Freq: Two times a day (BID) | NASAL | 0 refills | Status: DC
Start: 2023-08-13 — End: 2023-09-13

## 2023-08-13 NOTE — Progress Notes (Signed)
 I have spent 5 minutes in review of e-visit questionnaire, review and updating patient chart, medical decision making and response to patient.   Claiborne Rigg, NP

## 2023-08-13 NOTE — Progress Notes (Signed)

## 2023-08-14 ENCOUNTER — Other Ambulatory Visit: Payer: Self-pay | Admitting: Nurse Practitioner

## 2023-08-14 DIAGNOSIS — H60333 Swimmer's ear, bilateral: Secondary | ICD-10-CM

## 2023-08-14 MED ORDER — CIPROFLOXACIN-DEXAMETHASONE 0.3-0.1 % OT SUSP
4.0000 [drp] | Freq: Two times a day (BID) | OTIC | 0 refills | Status: DC
Start: 2023-08-14 — End: 2023-09-13

## 2023-08-15 ENCOUNTER — Ambulatory Visit
Admission: RE | Admit: 2023-08-15 | Discharge: 2023-08-15 | Disposition: A | Discharge status: Home / Self Care | Source: Ambulatory Visit | Attending: Family Medicine | Admitting: Family Medicine

## 2023-08-15 ENCOUNTER — Other Ambulatory Visit: Payer: Self-pay | Admitting: Family Medicine

## 2023-08-15 DIAGNOSIS — M25551 Pain in right hip: Secondary | ICD-10-CM

## 2023-08-15 DIAGNOSIS — M545 Low back pain, unspecified: Secondary | ICD-10-CM

## 2023-08-24 ENCOUNTER — Ambulatory Visit: Admitting: Sports Medicine

## 2023-08-24 ENCOUNTER — Encounter: Payer: Self-pay | Admitting: Sports Medicine

## 2023-08-24 ENCOUNTER — Other Ambulatory Visit: Payer: Self-pay

## 2023-08-24 DIAGNOSIS — M25851 Other specified joint disorders, right hip: Secondary | ICD-10-CM | POA: Diagnosis not present

## 2023-08-24 DIAGNOSIS — G8929 Other chronic pain: Secondary | ICD-10-CM | POA: Diagnosis not present

## 2023-08-24 DIAGNOSIS — M25551 Pain in right hip: Secondary | ICD-10-CM

## 2023-08-24 MED ORDER — MELOXICAM 15 MG PO TABS: 15.0000 mg | ORAL_TABLET | Freq: Every day | ORAL | 1 refills | Status: AC

## 2023-08-24 NOTE — Progress Notes (Signed)
 Claudia [name] - 43 y.o. female MRN [id number]  Date of birth: Jul 24, 1980  Office Visit Note: Visit Date: 08/24/2023 PCP: Pcp, No Referred by: No ref. provider found  Subjective: Chief Complaint  Patient presents with   Right Hip - Pain   HPI: Claudia [name] is a pleasant 43 y.o. female who presents today for chronic right lateral hip pain.  Claudia [name] has had pain in the right hip for about the last 5-6 months.  This was insidious onset.  She initially felt pain deep within the front hip and into the groin.  This has radiated inside the inner leg and over the posterior right buttock at times as well.  She did see her OB/GYN who had a pelvic ultrasound it did not show anything that was indicative of causing her pain.  Her pain will come and go, few weeks ago was rather bothersome and she will need to take ibuprofen  and Tylenol  for a week or 2 at a time to settle down her pain, but then this does improve her pain.  Does notice asymmetry with deep squats and lunges on the right hip compared to the left.  She has had no prior injury or surgery for the hip.  Pertinent ROS were reviewed with the patient and found to be negative unless otherwise specified above in HPI.   Assessment & Plan: Visit Diagnoses:  1. Femoroacetabular impingement of right hip   2. Chronic right hip pain    Plan: Impression is chronic right hip pain and anterior groin pain for the last 6 months with insidious onset.  X-rays do show a mild acetabular overhanging rim and her imaging with static and dynamic ultrasound does show a small cortical incongruency at the femoral head and neck junction that paired with her symptoms is indicative of functional hip impingement. We discussed the nature of this, nothing being surgical.  She is not having clicking catching or popping about the hip so I have a very low suspicion for labral pathology at this time.  We discussed PT or her doing home therapy for hip impingement, she is okay  to do this on her own looking at videos online, etc.  If she wishes to get in with a formal physical therapist she may call or message me.  I will send in meloxicam  15 mg for her to take for 4-7 day burst only when she is having a flare.  We did discuss the role for corticosteroid injection which would certainly help her pain but would not be a long-term solution given her functional FAI.  Encouraged continuing to remain active, weightlifting is great, discussed modification avoiding deep hip flexion and using good form with lunges.  She will follow-up with me as needed.  Follow-up: Return if symptoms worsen or fail to improve.   Meds & Orders:  Meds ordered this encounter  Medications   meloxicam  (MOBIC ) 15 MG tablet    Sig: Take 1 tablet (15 mg total) by mouth daily.    Dispense:  30 tablet    Refill:  1    Orders Placed This Encounter  Procedures   US  Extrem Low Right Ltd     Procedures: No procedures performed      Clinical History: No specialty comments available.  She reports that she has never smoked. She has never used smokeless tobacco. No results for input(s): HGBA1C, LABURIC in the last 8760 hours.  Objective:    Physical Exam  Gen: Well-appearing, in no acute  distress; non-toxic CV: Well-perfused. Warm.  Resp: Breathing unlabored on room air; no wheezing. Psych: Fluid speech in conversation; appropriate affect; normal thought process  Ortho Exam - Right hip: There is no greater trochanteric TTP or anterior hip tenderness.  No redness swelling or effusion.  There is fluid logroll with internal and external motion.  There is about 5 to 7 degrees less of internal FADIR testing on the right compared to the left side with mild guarding.  There is some discomfort but no significant pain.  Negative Stinchfield test.  Negative FABER testing.  Good hip abduction strength bilaterally.  Imaging: US  Extrem Low Right Ltd Result Date: 08/24/2023 Limited musculoskeletal  ultrasound of the right hip was performed today.  Evaluation of the ASIS shows no cortical regularity and proper insertion of the sartorius.  AIIS is within normal limits with proper insertion and activation of the rectus femoris tendon.  There is excellent curvature of the femoral head with preservation of cartilage.  No significant labral or posterior acetabular abnormality although limited on ultrasound visualization.  While evaluating the hip joint and the femoral head and neck region, there is a small bony spur, likely indicative of early cam deformity with a mild degree of hypoechoic fluid surrounding.  There is no hip joint effusion however.  Iliopsoas tendon was seen without tearing or abnormality.   Small, bony spur in femoral head-neck junction likely CAM lesion related leading to functional hip impingement  *2 view right hip x-rays including AP and lateral film from 08/15/2023 were reviewed by myself today.  There is a small overhanging of the acetabular ridge bilaterally.  There is a mild incongruency over the femoral head and neck junction on the right versus left with possible early cam lesion.  Preserved joint space without any arthritic change.  Narrative & Impression  CLINICAL DATA:  Hip pain.   EXAM: DG HIP (WITH OR WITHOUT PELVIS) 2-3V RIGHT   COMPARISON:  None Available.   FINDINGS: Pelvis is intact with normal and symmetric sacroiliac joints.   No acute fracture or dislocation.   No aggressive osseous lesion.   Visualized sacral arcuate lines are unremarkable.   Unremarkable symphysis pubis.   Unremarkable bilateral hip joints.   No radiopaque foreign bodies.   IMPRESSION: No acute osseous abnormality of the pelvis or right hip joint.     Electronically Signed   By: Ree Molt M.D.   On: 08/15/2023 13:14     Past Medical/Family/Surgical/Social History: Medications & Allergies reviewed per EMR, new medications updated. Patient Active Problem List    Diagnosis Date Noted   Maternal iron  deficiency anemia 04/24/2015   Acute blood loss anemia 04/24/2015   Status post vacuum-assisted vaginal delivery 04/24/2015   Gestational edema, postpartum 04/24/2015   Retained placenta or amniotic membrane after delivery without hemorrhage 04/23/2015   Postpartum care following vaginal delivery with manual placenta removal (3/22) 04/23/2015   PIH (pregnancy induced hypertension) 04/22/2015   Past Medical History:  Diagnosis Date   Chronic cystitis    urologist--- dr pace   History of anemia    History of kidney stones    History of ovarian cyst    History of pregnancy induced hypertension    Infertility, female    IUI x1, IVF x1   Intermenstrual bleeding    Wears contact lenses    Family History  Problem Relation Age of Onset   Other Neg Hx    Past Surgical History:  Procedure Laterality Date   BREAST  ENHANCEMENT SURGERY Bilateral    2008 and 2021   DILATATION & CURETTAGE/HYSTEROSCOPY WITH MYOSURE N/A 06/29/2022   Procedure: DILATATION & CURETTAGE/HYSTEROSCOPY WITH MYOSURE;  Surgeon: Rutherford Gain, MD;  Location: [facility] ;  Service: Gynecology;  Laterality: N/A;   OVUM / OOCYTE RETRIEVAL  06/09/2011   dr charlena haus   Social History   Occupational History   Not on file  Tobacco Use   Smoking status: Never   Smokeless tobacco: Never  Vaping Use   Vaping status: Never Used  Substance and Sexual Activity   Alcohol use: Yes    Alcohol/week: 3.0 - 4.0 standard drinks of alcohol    Types: 3 - 4 Standard drinks or equivalent per week   Drug use: Never   Sexual activity: Yes    Birth control/protection: None

## 2023-08-24 NOTE — Progress Notes (Signed)
 Patient says that she has had right hip pain for 5-6 months. She says that she feels it deep through the front of the hip, but it does track through the groin and around the inside of the leg to the right buttock. She says that it has gotten worse over time. She saw her OBGYN, and they told her that she has a couple of small cysts, but those do not seem to be causing her pain. She says that her pain will flare up around her periods, but when it is at its best it does not go away completely. She takes Motrin  and Tylenol  consistently and does get some relief with those. She also uses heat with some relief.

## 2023-09-13 ENCOUNTER — Telehealth: Admitting: Physician Assistant

## 2023-09-13 DIAGNOSIS — R3989 Other symptoms and signs involving the genitourinary system: Secondary | ICD-10-CM | POA: Diagnosis not present

## 2023-09-13 MED ORDER — CEPHALEXIN 500 MG PO CAPS: 500.0000 mg | ORAL_CAPSULE | Freq: Two times a day (BID) | ORAL | 0 refills | Status: AC

## 2023-09-13 NOTE — Progress Notes (Signed)
 I have spent 5 minutes in review of e-visit questionnaire, review and updating patient chart, medical decision making and response to patient.   Elsie Velma Lunger, PA-C

## 2023-09-13 NOTE — Progress Notes (Signed)

## 2023-11-09 ENCOUNTER — Other Ambulatory Visit: Payer: Self-pay | Admitting: Obstetrics and Gynecology

## 2023-11-21 ENCOUNTER — Encounter (HOSPITAL_COMMUNITY): Payer: Self-pay | Admitting: Obstetrics and Gynecology

## 2023-11-21 NOTE — Progress Notes (Signed)
 Spoke w/ via phone for pre-op interview--- [employer]----  UPT and CBC per Careers adviser.       Lab results------ COVID test -----patient states asymptomatic no test needed Arrive at -------0530 NPO after MN NO Solid Food.   Pre-Surgery Ensure or G2:  Med rec completed Medications to take morning of surgery ----- Tylenol  PRN Diabetic medication -----  GLP1 agonist last dose: GLP1 instructions:  Patient instructed no nail polish to be worn day of surgery Patient instructed to bring photo id and insurance card day of surgery Patient aware to have Driver (ride ) / caregiver    for 24 hours after surgery - Husband Taylor Spilde Patient Special Instructions ----- Pre-Op special Instructions -----  Patient verbalized understanding of instructions that were given at this phone interview. Patient denies chest pain, sob, fever, cough at the interview.

## 2023-11-22 NOTE — Anesthesia Preprocedure Evaluation (Signed)
 Anesthesia Evaluation  Patient identified by MRN, date of birth, ID band Patient awake    Reviewed: Allergy & Precautions, NPO status , Patient's Chart, lab work & pertinent test results  Airway Mallampati: I  TM Distance: >3 FB Neck ROM: Full    Dental  (+) Teeth Intact, Dental Advisory Given   Pulmonary neg pulmonary ROS   Pulmonary exam normal breath sounds clear to auscultation       Cardiovascular negative cardio ROS Normal cardiovascular exam Rhythm:Regular Rate:Normal     Neuro/Psych negative neurological ROS  negative psych ROS   GI/Hepatic negative GI ROS, Neg liver ROS,,,  Endo/Other  negative endocrine ROS    Renal/GU negative Renal ROS  negative genitourinary   Musculoskeletal negative musculoskeletal ROS (+)    Abdominal   Peds  Hematology negative hematology ROS (+)   Anesthesia Other Findings   Reproductive/Obstetrics Ovarian cyst                               Anesthesia Physical Anesthesia Plan  ASA: 1  Anesthesia Plan: General   Post-op Pain Management: Tylenol  PO (pre-op)*, Toradol  IV (intra-op)*, Ketamine IV* and Dilaudid IV   Induction: Intravenous  PONV Risk Score and Plan: 4 or greater and Ondansetron , Dexamethasone , Midazolam , Treatment may vary due to age or medical condition, Propofol  infusion and TIVA  Airway Management Planned: Oral ETT  Additional Equipment: None  Intra-op Plan:   Post-operative Plan: Extubation in OR  Informed Consent: I have reviewed the patients History and Physical, chart, labs and discussed the procedure including the risks, benefits and alternatives for the proposed anesthesia with the patient or authorized representative who has indicated his/her understanding and acceptance.     Dental advisory given  Plan Discussed with: CRNA  Anesthesia Plan Comments:          Anesthesia Quick Evaluation

## 2023-11-25 ENCOUNTER — Ambulatory Visit (HOSPITAL_COMMUNITY)
Admission: RE | Admit: 2023-11-25 | Discharge: 2023-11-25 | Disposition: A | Discharge status: Home / Self Care | Attending: Obstetrics and Gynecology | Admitting: Obstetrics and Gynecology

## 2023-11-25 ENCOUNTER — Ambulatory Visit (HOSPITAL_COMMUNITY): Admitting: Anesthesiology

## 2023-11-25 ENCOUNTER — Encounter (HOSPITAL_COMMUNITY)
Admission: RE | Disposition: A | Discharge status: Home / Self Care | Payer: Self-pay | Source: Home / Self Care | Attending: Obstetrics and Gynecology

## 2023-11-25 ENCOUNTER — Encounter (HOSPITAL_COMMUNITY): Payer: Self-pay | Admitting: Obstetrics and Gynecology

## 2023-11-25 ENCOUNTER — Other Ambulatory Visit: Payer: Self-pay

## 2023-11-25 ENCOUNTER — Other Ambulatory Visit (HOSPITAL_COMMUNITY): Payer: Self-pay

## 2023-11-25 DIAGNOSIS — N80319 Endometriosis of the anterior cul-de-sac, unspecified depth: Secondary | ICD-10-CM | POA: Insufficient documentation

## 2023-11-25 DIAGNOSIS — N83291 Other ovarian cyst, right side: Secondary | ICD-10-CM | POA: Insufficient documentation

## 2023-11-25 DIAGNOSIS — Z01818 Encounter for other preprocedural examination: Secondary | ICD-10-CM

## 2023-11-25 DIAGNOSIS — N83202 Unspecified ovarian cyst, left side: Secondary | ICD-10-CM

## 2023-11-25 DIAGNOSIS — N736 Female pelvic peritoneal adhesions (postinfective): Secondary | ICD-10-CM | POA: Insufficient documentation

## 2023-11-25 DIAGNOSIS — N83201 Unspecified ovarian cyst, right side: Secondary | ICD-10-CM | POA: Diagnosis not present

## 2023-11-25 DIAGNOSIS — N80101 Endometriosis of right ovary, unspecified depth: Secondary | ICD-10-CM | POA: Insufficient documentation

## 2023-11-25 DIAGNOSIS — R1031 Right lower quadrant pain: Secondary | ICD-10-CM | POA: Diagnosis present

## 2023-11-25 DIAGNOSIS — N83292 Other ovarian cyst, left side: Secondary | ICD-10-CM | POA: Insufficient documentation

## 2023-11-25 DIAGNOSIS — N80399 Endometriosis of the pelvic peritoneum, other specified sites, unspecified depth: Secondary | ICD-10-CM | POA: Insufficient documentation

## 2023-11-25 LAB — CBC
HCT: 39.6 % (ref 36.0–46.0)
Hemoglobin: 13.2 g/dL (ref 12.0–15.0)
MCH: 30.6 pg (ref 26.0–34.0)
MCHC: 33.3 g/dL (ref 30.0–36.0)
MCV: 91.7 fL (ref 80.0–100.0)
Platelets: 221 K/uL (ref 150–400)
RBC: 4.32 MIL/uL (ref 3.87–5.11)
RDW: 12 % (ref 11.5–15.5)
WBC: 5.6 K/uL (ref 4.0–10.5)
nRBC: 0 % (ref 0.0–0.2)

## 2023-11-25 LAB — POCT PREGNANCY, URINE: Preg Test, Ur: NEGATIVE

## 2023-11-25 SURGERY — EXCISION, CYST, OVARY, ROBOT-ASSISTED, LAPAROSCOPIC
Anesthesia: General | Site: Pelvis | Laterality: Bilateral

## 2023-11-25 MED ORDER — MIDAZOLAM HCL 2 MG/2ML IJ SOLN
INTRAMUSCULAR | Status: AC
  Filled 2023-11-25: qty 2

## 2023-11-25 MED ORDER — FENTANYL CITRATE (PF) 250 MCG/5ML IJ SOLN
INTRAMUSCULAR | Status: DC | PRN
  Administered 2023-11-25 (×5): 50 ug via INTRAVENOUS

## 2023-11-25 MED ORDER — FENTANYL CITRATE (PF) 250 MCG/5ML IJ SOLN
INTRAMUSCULAR | Status: AC
  Filled 2023-11-25: qty 5

## 2023-11-25 MED ORDER — ROCURONIUM BROMIDE 10 MG/ML (PF) SYRINGE
PREFILLED_SYRINGE | INTRAVENOUS | Status: DC | PRN
  Administered 2023-11-25 (×2): 10 mg via INTRAVENOUS
  Administered 2023-11-25: 30 mg via INTRAVENOUS
  Administered 2023-11-25: 20 mg via INTRAVENOUS
  Administered 2023-11-25: 50 mg via INTRAVENOUS
  Administered 2023-11-25 (×3): 20 mg via INTRAVENOUS

## 2023-11-25 MED ORDER — DEXAMETHASONE SOD PHOSPHATE PF 10 MG/ML IJ SOLN
INTRAMUSCULAR | Status: DC | PRN
Start: 2023-11-25 — End: 2023-11-25
  Administered 2023-11-25: 10 mg via INTRAVENOUS

## 2023-11-25 MED ORDER — KETOROLAC TROMETHAMINE 30 MG/ML IJ SOLN: 30.0000 mg | Freq: Once | INTRAMUSCULAR | Status: DC | PRN

## 2023-11-25 MED ORDER — PROPOFOL 10 MG/ML IV BOLUS
INTRAVENOUS | Status: DC | PRN
  Administered 2023-11-25: 30 mg via INTRAVENOUS
  Administered 2023-11-25: 150 mg via INTRAVENOUS
  Administered 2023-11-25: 20 mg via INTRAVENOUS

## 2023-11-25 MED ORDER — KETOROLAC TROMETHAMINE 30 MG/ML IJ SOLN
INTRAMUSCULAR | Status: DC | PRN
Start: 2023-11-25 — End: 2023-11-25
  Administered 2023-11-25: 30 mg via INTRAVENOUS

## 2023-11-25 MED ORDER — 0.9 % SODIUM CHLORIDE (POUR BTL) OPTIME
TOPICAL | Status: DC | PRN
  Administered 2023-11-25: 1000 mL

## 2023-11-25 MED ORDER — OXYCODONE HCL 5 MG PO TABS
5.0000 mg | ORAL_TABLET | Freq: Four times a day (QID) | ORAL | 0 refills | Status: AC | PRN
Start: 2023-11-25 — End: 2023-12-02
  Filled 2023-11-25: qty 28, 7d supply, fill #0

## 2023-11-25 MED ORDER — ORAL CARE MOUTH RINSE: 15.0000 mL | Freq: Once | OROMUCOSAL | Status: AC

## 2023-11-25 MED ORDER — OXYCODONE HCL 5 MG PO TABS: 5.0000 mg | ORAL_TABLET | Freq: Once | ORAL | Status: DC | PRN

## 2023-11-25 MED ORDER — ACETAMINOPHEN 500 MG PO TABS: 1000.0000 mg | ORAL_TABLET | Freq: Once | ORAL | Status: DC

## 2023-11-25 MED ORDER — POVIDONE-IODINE 10 % EX SWAB
2.0000 | Freq: Once | CUTANEOUS | Status: AC
  Administered 2023-11-25: 2 via TOPICAL

## 2023-11-25 MED ORDER — MIDAZOLAM HCL (PF) 2 MG/2ML IJ SOLN
INTRAMUSCULAR | Status: DC | PRN
  Administered 2023-11-25: 2 mg via INTRAVENOUS

## 2023-11-25 MED ORDER — KETAMINE HCL 50 MG/5ML IJ SOSY
PREFILLED_SYRINGE | INTRAMUSCULAR | Status: AC
Start: 2023-11-25 — End: 2023-11-25
  Filled 2023-11-25: qty 5

## 2023-11-25 MED ORDER — HYDROMORPHONE HCL 1 MG/ML IJ SOLN
INTRAMUSCULAR | Status: AC
  Filled 2023-11-25: qty 0.5

## 2023-11-25 MED ORDER — ONDANSETRON HCL 4 MG/2ML IJ SOLN
INTRAMUSCULAR | Status: DC | PRN
  Administered 2023-11-25: 4 mg via INTRAVENOUS

## 2023-11-25 MED ORDER — LIDOCAINE 2% (20 MG/ML) 5 ML SYRINGE
INTRAMUSCULAR | Status: DC | PRN
  Administered 2023-11-25: 50 mg via INTRAVENOUS

## 2023-11-25 MED ORDER — CHLORHEXIDINE GLUCONATE 0.12 % MT SOLN
15.0000 mL | Freq: Once | OROMUCOSAL | Status: AC
  Administered 2023-11-25: 15 mL via OROMUCOSAL
  Filled 2023-11-25: qty 15

## 2023-11-25 MED ORDER — HYDROMORPHONE HCL 1 MG/ML IJ SOLN
INTRAMUSCULAR | Status: DC | PRN
  Administered 2023-11-25 (×3): .5 mg via INTRAVENOUS

## 2023-11-25 MED ORDER — SUGAMMADEX SODIUM 200 MG/2ML IV SOLN
INTRAVENOUS | Status: DC | PRN
  Administered 2023-11-25: 200 mg via INTRAVENOUS

## 2023-11-25 MED ORDER — MEPERIDINE HCL 25 MG/ML IJ SOLN
6.2500 mg | INTRAMUSCULAR | Status: DC | PRN
  Filled 2023-11-25 (×2): qty 1

## 2023-11-25 MED ORDER — KETAMINE HCL 50 MG/5ML IJ SOSY
PREFILLED_SYRINGE | INTRAMUSCULAR | Status: DC | PRN
  Administered 2023-11-25: 25 mg via INTRAVENOUS

## 2023-11-25 MED ORDER — ONDANSETRON HCL 4 MG/2ML IJ SOLN: 4.0000 mg | Freq: Once | INTRAMUSCULAR | Status: DC | PRN

## 2023-11-25 MED ORDER — BUPIVACAINE HCL (PF) 0.25 % IJ SOLN
INTRAMUSCULAR | Status: DC | PRN
  Administered 2023-11-25: 12 mL

## 2023-11-25 MED ORDER — LACTATED RINGERS IV SOLN: INTRAVENOUS | Status: DC | PRN

## 2023-11-25 MED ORDER — AMISULPRIDE (ANTIEMETIC) 5 MG/2ML IV SOLN: 10.0000 mg | Freq: Once | INTRAVENOUS | Status: DC | PRN

## 2023-11-25 MED ORDER — HEMOSTATIC AGENTS (NO CHARGE) OPTIME
TOPICAL | Status: DC | PRN
  Administered 2023-11-25 (×5): 1

## 2023-11-25 MED ORDER — KETAMINE HCL 10 MG/ML IJ SOLN: INTRAMUSCULAR | Status: DC | PRN

## 2023-11-25 MED ORDER — OXYCODONE HCL 5 MG/5ML PO SOLN: 5.0000 mg | Freq: Once | ORAL | Status: DC | PRN

## 2023-11-25 MED ORDER — SODIUM CHLORIDE 0.9 % IR SOLN
Status: DC | PRN
  Administered 2023-11-25 (×3): 1000 mL

## 2023-11-25 MED ORDER — PROPOFOL 10 MG/ML IV BOLUS
INTRAVENOUS | Status: AC
Start: 2023-11-25 — End: 2023-11-25
  Filled 2023-11-25: qty 20

## 2023-11-25 MED ORDER — HYDROMORPHONE HCL 1 MG/ML IJ SOLN: 0.2500 mg | INTRAMUSCULAR | Status: DC | PRN

## 2023-11-25 MED ORDER — LACTATED RINGERS IV SOLN
INTRAVENOUS | Status: DC
Start: 2023-11-25 — End: 2023-11-25

## 2023-11-25 MED ORDER — PROPOFOL 500 MG/50ML IV EMUL
INTRAVENOUS | Status: DC | PRN
  Administered 2023-11-25: 125 ug/kg/min via INTRAVENOUS

## 2023-11-25 SURGICAL SUPPLY — 61 items
APPLICATOR ARISTA FLEXITIP XL (MISCELLANEOUS) IMPLANT
CATH FOLEY 3WAY 5CC 16FR (CATHETERS) ×2 IMPLANT
CORD MONOPOLAR (MISCELLANEOUS) IMPLANT
COVER BACK TABLE 60X90IN (DRAPES) ×2 IMPLANT
COVER TIP SHEARS 8 DVNC (MISCELLANEOUS) ×2 IMPLANT
DEFOGGER SCOPE WARM SEASHARP (MISCELLANEOUS) ×2 IMPLANT
DERMABOND ADVANCED .7 DNX12 (GAUZE/BANDAGES/DRESSINGS) ×2 IMPLANT
DRAPE ARM DVNC X/XI (DISPOSABLE) ×8 IMPLANT
DRAPE COLUMN DVNC XI (DISPOSABLE) ×2 IMPLANT
DRAPE SURG IRRIG POUCH 19X23 (DRAPES) ×2 IMPLANT
DRAPE UTILITY XL STRL (DRAPES) ×2 IMPLANT
DRIVER NDL MEGA SUTCUT DVNCXI (INSTRUMENTS) ×2 IMPLANT
DRIVER NDLE MEGA SUTCUT DVNCXI (INSTRUMENTS) IMPLANT
DURAPREP 26ML APPLICATOR (WOUND CARE) ×2 IMPLANT
ELECTRODE REM PT RTRN 9FT ADLT (ELECTROSURGICAL) ×2 IMPLANT
FORCEPS BPLR LNG DVNC XI (INSTRUMENTS) ×2 IMPLANT
FORCEPS LONG TIP 8 DVNC XI (FORCEP) ×2 IMPLANT
GLOVE BIOGEL PI IND STRL 7.0 (GLOVE) ×10 IMPLANT
GLOVE ECLIPSE 6.5 STRL STRAW (GLOVE) ×6 IMPLANT
GLOVE SURG SS PI 7.0 STRL IVOR (GLOVE) IMPLANT
GOWN STRL REUS W/ TWL LRG LVL3 (GOWN DISPOSABLE) IMPLANT
HEMOSTAT ARISTA ABSORB 3G PWDR (HEMOSTASIS) IMPLANT
HIBICLENS CHG 4% 4OZ BTL (MISCELLANEOUS) ×2 IMPLANT
IRRIGATION STRYKERFLOW (MISCELLANEOUS) ×2 IMPLANT
KIT PINK PAD W/HEAD ARM REST (MISCELLANEOUS) ×2 IMPLANT
LEGGING LITHOTOMY PAIR STRL (DRAPES) ×2 IMPLANT
NDL INSUFFLATION 14GA 120MM (NEEDLE) ×2 IMPLANT
NEEDLE INSUFFLATION 14GA 120MM (NEEDLE) ×2 IMPLANT
NS IRRIG 500ML POUR BTL (IV SOLUTION) IMPLANT
OBTURATOR OPTICALSTD 8 DVNC (TROCAR) ×2 IMPLANT
OCCLUDER COLPOPNEUMO (BALLOONS) ×2 IMPLANT
PACK ROBOT WH (CUSTOM PROCEDURE TRAY) ×2 IMPLANT
PACK ROBOTIC GOWN (GOWN DISPOSABLE) ×2 IMPLANT
PAD OB MATERNITY 11 LF (PERSONAL CARE ITEMS) ×2 IMPLANT
POWDER SURGICEL 3.0 GRAM (HEMOSTASIS) IMPLANT
SCISSORS LAP 5X35 DISP (ENDOMECHANICALS) IMPLANT
SCISSORS MNPLR CVD DVNC XI (INSTRUMENTS) ×2 IMPLANT
SEAL UNIV 5-12 XI (MISCELLANEOUS) ×8 IMPLANT
SEALER VESSEL EXT DVNC XI (MISCELLANEOUS) ×2 IMPLANT
SEPRAFILM MEMBRANE 5X6 (MISCELLANEOUS) IMPLANT
SET IRRIG Y TYPE TUR BLADDER L (SET/KITS/TRAYS/PACK) IMPLANT
SET TRI-LUMEN FLTR TB AIRSEAL (TUBING) ×2 IMPLANT
SOLN STERILE WATER BTL 1000 ML (IV SOLUTION) ×2 IMPLANT
SPIKE FLUID TRANSFER (MISCELLANEOUS) ×2 IMPLANT
STOPCOCK CYSTOSCOPE 4 WAY (CONNECTOR) IMPLANT
SUT MNCRL AB 4-0 PS2 18 (SUTURE) IMPLANT
SUT MON AB 3-0 SH27 (SUTURE) IMPLANT
SUT VIC AB 0 CT1 36 (SUTURE) ×4 IMPLANT
SUT VIC AB 4-0 PS2 18 (SUTURE) ×4 IMPLANT
SUT VLOC 180 0 9IN GS21 (SUTURE) ×2 IMPLANT
SUT VLOC 180 2-0 9IN GS21 (SUTURE) IMPLANT
TIP ENDOSCOPIC SURGICEL (TIP) IMPLANT
TIP RUMI ORANGE 6.7MMX12CM (TIP) IMPLANT
TIP UTERINE 5.1X6CM LAV DISP (MISCELLANEOUS) IMPLANT
TIP UTERINE 6.7X10CM GRN DISP (MISCELLANEOUS) IMPLANT
TIP UTERINE 6.7X6CM WHT DISP (MISCELLANEOUS) IMPLANT
TIP UTERINE 6.7X8CM BLUE DISP (MISCELLANEOUS) IMPLANT
TOWEL GREEN STERILE (TOWEL DISPOSABLE) ×2 IMPLANT
TROCAR PORT AIRSEAL 5X120 (TROCAR) IMPLANT
TROCAR PORT AIRSEAL 8X120 (TROCAR) ×2 IMPLANT
UNDERPAD 30X36 HEAVY ABSORB (UNDERPADS AND DIAPERS) ×2 IMPLANT

## 2023-11-25 NOTE — Discharge Instructions (Signed)
CALL  IF TEMP>100.4, NOTHING PER VAGINA X 2 WK, CALL IF SOAKING A MAXI  PAD EVERY HOUR OR MORE FREQUENTLY Warm heat to abdomen every 4 hours x 24 hrs

## 2023-11-25 NOTE — Brief Op Note (Signed)
 11/25/2023  11:43 AM  PATIENT:  Claudia [name]  43 y.o. female  PRE-OPERATIVE DIAGNOSIS:   RLQ  pain/ pelvic pain,  bilateral ovarian cyst  POST-OPERATIVE DIAGNOSIS:    Stage 4 pelvic endometriosis, RLQ pain/ pelvic pain, bilateral endometriomas  PROCEDURE:  Procedure(s): EXCISION, CYST, OVARY, ROBOT-ASSISTED, LAPAROSCOPIC (Bilateral) EXCISION, ENDOMETRIOSIS, ROBOTIC ASSISTED, LAPAROSCOPIC LYSIS, ADHESIONS, ROBOT-ASSISTED, LAPAROSCOPIC  SURGEON:  Surgeons and Role:    * Rutherford Gain, MD - Primary  PHYSICIAN ASSISTANT:   ASSISTANTS: daine Abbot RNFA   ANESTHESIA:   general Findings: extensive endometriosis, enlarged right ovary with endometriomas, left ovary with endometrioma adherent to left  ov fossa,  nl right tube, left tube distorted with adhesion posterior cul de sac and uterus encased in adhesions due to endometriosis, endometriotic implants on bowel, left anterior cul de sac, appendix with focal adhesion, nl liver edge RV uterus EBL:  50 mL   BLOOD ADMINISTERED:none  DRAINS: none   LOCAL MEDICATIONS USED:  MARCAINE      SPECIMEN:  Source of Specimen:  endometriotic implants and ovarian cyst wall  DISPOSITION OF SPECIMEN:  PATHOLOGY  COUNTS:  YES  TOURNIQUET:  * No tourniquets in log *  DICTATION: .Other Dictation: Dictation Number [id number]  PLAN OF CARE: Discharge to home after PACU  PATIENT DISPOSITION:  PACU - hemodynamically stable.   Delay start of Pharmacological VTE agent (>24hrs) due to surgical blood loss or risk of bleeding: no

## 2023-11-25 NOTE — Anesthesia Procedure Notes (Signed)
 Procedure Name: Intubation Date/Time: 11/25/2023 7:33 AM  Performed by: Lamar Lucie DASEN, CRNAPre-anesthesia Checklist: Patient identified, Emergency Drugs available, Suction available and Patient being monitored Patient Re-evaluated:Patient Re-evaluated prior to induction Oxygen Delivery Method: Circle system utilized Preoxygenation: Pre-oxygenation with 100% oxygen Induction Type: IV induction Ventilation: Mask ventilation without difficulty Laryngoscope Size: Mac and 3 Grade View: Grade I Tube type: Oral Tube size: 6.5 mm Number of attempts: 1 Airway Equipment and Method: Stylet and Oral airway Placement Confirmation: ETT inserted through vocal cords under direct vision, positive ETCO2 and breath sounds checked- equal and bilateral Secured at: 22 cm Tube secured with: Tape Dental Injury: Teeth and Oropharynx as per pre-operative assessment

## 2023-11-25 NOTE — H&P (Signed)
 Claudia [name] is an 43 y.o. female. G2P2 MWF presents for surgical mgmt of persistent RLQ/groin pain and bilateral complex ovarian cyst ? Pelvic endometriosis  Pertinent Gynecological History: Menses: nl Bleeding: intermenstrual bleeding Contraception: vasectomy DES exposure: denies Blood transfusions: none Sexually transmitted diseases: no past history Previous GYN Procedures: DNC  Last mammogram: normal Date: 2025 Last pap: normal Date: 2025 OB History: G2, P2   Menstrual History: Menarche age: n/a Patient's last menstrual period was 11/15/2023 (approximate).    Past Medical History:  Diagnosis Date   Chronic cystitis    urologist--- dr pace   History of anemia    History of kidney stones    History of ovarian cyst    History of pregnancy induced hypertension    Infertility, female    IUI x1, IVF x1   Intermenstrual bleeding    Wears contact lenses     Past Surgical History:  Procedure Laterality Date   BREAST ENHANCEMENT SURGERY Bilateral    2008 and 2021   DILATATION & CURETTAGE/HYSTEROSCOPY WITH MYOSURE N/A 06/29/2022   Procedure: DILATATION & CURETTAGE/HYSTEROSCOPY WITH MYOSURE;  Surgeon: Rutherford Gain, MD;  Location: [facility];  Service: Gynecology;  Laterality: N/A;   OVUM / OOCYTE RETRIEVAL  06/09/2011   dr charlena haus    Family History  Problem Relation Age of Onset   Other Neg Hx     Social History:  reports that she has never smoked. She has never used smokeless tobacco. She reports current alcohol use of about 3.0 - 4.0 standard drinks of alcohol per week. She reports that she does not use drugs.  Allergies: No Known Allergies  No medications prior to admission.    Review of Systems  All other systems reviewed and are negative.   Height 5' 5 (1.651 m), weight 54 kg, last menstrual period 11/15/2023, not currently breastfeeding. Physical Exam Constitutional:      Appearance: Normal appearance.  HENT:      Head: Atraumatic.  Eyes:     Extraocular Movements: Extraocular movements intact.  Cardiovascular:     Rate and Rhythm: Regular rhythm.     Heart sounds: Normal heart sounds.  Pulmonary:     Breath sounds: Normal breath sounds.  Abdominal:     Palpations: Abdomen is soft.  Genitourinary:    General: Normal vulva.     Comments: Vagina nl Cervix parous Uterus RV  Adnexa  fullness Musculoskeletal:        General: Normal range of motion.     Cervical back: Neck supple.  Skin:    General: Skin is warm and dry.  Neurological:     General: No focal deficit present.     Mental Status: She is alert and oriented to person, place, and time.  Psychiatric:        Mood and Affect: Mood normal.        Behavior: Behavior normal.     No results found for this or any previous visit (from the past 24 hours).  No results found.  Assessment/Plan: Bilateral complex ovarian cyst Right lower quadrant pain P) daVinci robotic excision of ovarian cysts, resection of pelvic endometriosis if present. Risk of surgery reviewed including infection, bleeding, injury to surrounding organ structure, internal scar tissue, thermal injury.   Shawon Denzer A Jaymee Tilson 11/25/2023, 4:31 AM

## 2023-11-25 NOTE — Anesthesia Postprocedure Evaluation (Signed)
 Anesthesia Post Note  Patient: Claudia [name]  Procedure(s) Performed: EXCISION, CYST, OVARY, ROBOT-ASSISTED, LAPAROSCOPIC (Bilateral: Pelvis) EXCISION, ENDOMETRIOSIS, ROBOTIC ASSISTED, LAPAROSCOPIC (Pelvis) LYSIS, ADHESIONS, ROBOT-ASSISTED, LAPAROSCOPIC (Pelvis)     Patient location during evaluation: Phase II Anesthesia Type: General Level of consciousness: awake and alert, oriented and patient cooperative Pain management: pain level controlled Vital Signs Assessment: post-procedure vital signs reviewed and stable Respiratory status: spontaneous breathing, nonlabored ventilation and respiratory function stable Cardiovascular status: blood pressure returned to baseline and stable Postop Assessment: no apparent nausea or vomiting Anesthetic complications: no   No notable events documented.  Last Vitals:  Vitals:   11/25/23 1200 11/25/23 1215  BP: 124/71 (!) 118/56  Pulse: 68 68  Resp: 16 14  Temp:    SpO2: 100% 100%    Last Pain:  Vitals:   11/25/23 1215  TempSrc:   PainSc: 3                  [name]

## 2023-11-25 NOTE — Transfer of Care (Signed)
 Immediate Anesthesia Transfer of Care Note  Patient: Claudia [name]  Procedure(s) Performed: EXCISION, CYST, OVARY, ROBOT-ASSISTED, LAPAROSCOPIC (Bilateral: Pelvis) EXCISION, ENDOMETRIOSIS, ROBOTIC ASSISTED, LAPAROSCOPIC (Pelvis) LYSIS, ADHESIONS, ROBOT-ASSISTED, LAPAROSCOPIC (Pelvis)  Patient Location: PACU  Anesthesia Type:General  Level of Consciousness: drowsy and patient cooperative  Airway & Oxygen Therapy: Patient Spontanous Breathing  Post-op Assessment: Report given to RN, Post -op Vital signs reviewed and stable, and Patient moving all extremities X 4  Post vital signs: Reviewed and stable  Last Vitals:  Vitals Value Taken Time  BP 114/61 11/25/23 11:17  Temp    Pulse 85 11/25/23 11:17  Resp 17 11/25/23 11:18  SpO2 98 % 11/25/23 11:17  Vitals shown include unfiled device data.  Last Pain:  Vitals:   11/25/23 0649  TempSrc: Oral  PainSc: 3       Patients Stated Pain Goal: 3 (11/25/23 9350)  Complications: No notable events documented.

## 2023-11-25 NOTE — Interval H&P Note (Signed)
 History and Physical Interval Note:  11/25/2023 7:08 AM  Claudia [name]  has presented today for surgery, with the diagnosis of pelvic pain, ovarian cyst.  The various methods of treatment have been discussed with the patient and family. After consideration of risks, benefits and other options for treatment, the patient has consented to  Procedure(s): EXCISION, CYST, OVARY, ROBOT-ASSISTED, LAPAROSCOPIC (N/A) EXCISION, ENDOMETRIOSIS, ROBOTIC ASSISTED, LAPAROSCOPIC as a surgical intervention.  The patient's history has been reviewed, patient examined, no change in status, stable for surgery.  I have reviewed the patient's chart and labs.  Questions were answered to the patient's satisfaction.     Shimshon Narula A Julien Oscar

## 2023-11-26 ENCOUNTER — Encounter (HOSPITAL_COMMUNITY): Payer: Self-pay | Admitting: Obstetrics and Gynecology

## 2023-11-26 NOTE — Op Note (Unsigned)
 [place], Claudia [name] MEDICAL RECORD NO: [id number] ACCOUNT NO: [id number] DATE OF BIRTH: 01-27-1981 FACILITY: [facility] LOCATION: [facility] PHYSICIAN: Rana Hochstein A. Rutherford, MD  Operative Report   DATE OF PROCEDURE: 11/25/2023  PREOPERATIVE DIAGNOSES: 1. Right lower quadrant pain/groin pain. 2. Bilateral ovarian cysts.  PROCEDURE: Da Vinci robotic excision of pelvic endometriosis, extensive lysis of adhesions, bilateral ovarian cystectomy.  POSTOPERATIVE DIAGNOSES: 1. Stage IV pelvic endometriosis. 2. Right lower quadrant pain/groin. 3. Severe pelvic adhesions ANESTHESIA: General.  SURGEON: Dickie Rutherford, MD  ASSISTANT: Daine Abbot, RNFA  DESCRIPTION OF PROCEDURE: Under adequate general anesthesia, the patient was placed in the dorsal lithotomy position. She was positioned for robotic surgery. She was sterilely prepped and draped in the usual fashion. An indwelling Foley catheter was  sterilely placed. Bivalve speculum was placed in the vagina. A single-tooth tenaculum was placed on the anterior lip of the cervix. The uterus sounded to 8 cm. A #8 uterine manipulator was introduced into the uterine cavity. The bivalve speculum was  removed. Attention was then turned to the abdomen. 0.25% Marcaine  was injected supraumbilically. A supraumbilical small incision was made. A Veress needle was introduced and tested with a drop test. Opening pressure of 4 was noted. 2.1 liters of CO2 was  insufflated. The Veress needle was then removed. A robotic port was introduced into the abdomen without incident. The lighted robotic camera was then inserted confirming entry into the abdomen without incident. It was noted very active bowel, brownish  darkening on the bowel on some aspects of the bowel was noted consistent with endometriosis. Normal liver edge and gallbladder were seen. The patient was placed in the Trendelenburg position. Inspection was notable for fluid-filled blebs on the fundus of the  uterus ,in the posterior cul-de-sac with obliteration of the posterior cul-de-sac as bowel, ovaries were encased in adhesions. Visualization of an enlarged cystic right ovary encased in adhesions posteriorly was noted, right fallopian tube was also encased in adhesions  and attached to the right ovary with the fimbriated end identifiable. The left ovary could not be seen as it was obscured by bowel adherent to the posterior wall of the uterus. Bowel was adherent to the left lower quadrant and dives into the posterior cul-de-sac. A small portion of the left fimbriated end of the tube could be seen adjacent to the uterus. Additional port sites were  then placed bilaterally after 0.25% Marcaine  was injected and an intervening incision for AirSeal was made on the left. Robotic ports were all placed under direct visualization. The robot was then docked. In arm #1 was bipolar scissors and arm #3 was the monopolar scissors. I then went to the surgical console.  At the surgical console, the procedure was started with removal of the adhesions surrounding the posterior cul-de-sac and including the fundal fluid filled  blebs. Once that was started, the dissection resulted in incidental rupture of the right ovarian cyst with a large amount  of chocolate fluid removed and that partially decompressed the right ovary. The ovary was then dissected off the posterior aspect of the uterus and lifted out of the cul-de-sac. Some uterosacral ligament endometriotic implants were noted with pulling on the right. The right pelvic  sidewall had the ovary then being lifted off of that and endometriotic implants removed off the ovary. The right ovary was then further opened. The 2 ovarian cystic walls were ultimately removed and the surrounding adhesions were continued to be lysed. The peritubal adhesion on the right tube was lysed which freed up  that tube. The endometriosis over the external surface of the right ovary was also removed.  Attention was then turned to the remaining part of the pelvis. The bowel was draped and attached posteriorly. Careful dissection around that area resulted  in freeing up that bowel and resulted in the bowels being able to be removed off of the posterior wall of the uterus. That dissection resulted in the left ovarian cyst ruptured and chocolate fluid drained out. That left ovary was  much smaller than the right ovary and was attached to the left  ovarian fossa. The left ureter could not be seen. The opening of the left cyst was gently explored for additional pocket of fluid and was completely drained. The fallopian tube was shortened but freed up from the left ovary. The decision was then made not to proceed with dissecting that ovary off of that left wall. Once that was done, posterior cul-de-sac further  endometriotic implants were removed. The  left anterior cul-de-sac had a some  endometriosis, which was also removed. The appendix was inspected. It had adhesion, which was partially lysed, but due to the end of it looking a bit stiff/hard,  intraoperatively general surgeon was called to look to see if the appendix needed to be removed and his  inspection just summarized that it was a normal appendix and I made the decision not to remove the appendix at that point. The left adhesion of the bowel was  left alone as well in the manner that it was placed, recognizing that this would at this point keep the bowel from dropping down further into the posterior cul-de-sac. The pelvis was then vigorously irrigated. It was then noted that partially a part of  the proximal part of the uterus/the fundal part of the uterus had probably been a little bit denuded with some bleeding, and decision was then made to suture that area back. This was done using 2-0 V-Loc suture. The ovary on the right was inspected. 2-0  Monocryl suture was used to do a 4-point closure of that ovary. Hemostasis with cauterization. Once it was  felt that the procedure was completed, the abdomen was again vigorously irrigated and suctioned. The robotic instruments were then removed. The  robot was undocked. I went back to the patient's bedside sterilely using cauterization to bleeding that was noted on the posterior aspect of the uterus. Surgicel slurry was then instilled and vigorously irrigated as per protocol.  It was placed twice and Arista starch was placed  on that posterior aspect thereafter, and then Seprafilm slurry was placed. Once that was done, the robotic instruments were removed. The AirSeal was removed. The abdomen was deflated and the incisions were closed with 4-0 Vicryl subcuticular closures. The instruments in  the vagina were removed.  SPECIMEN: Endometriotic implants and ovarian cyst walls sent to pathology.  ESTIMATED BLOOD LOSS: 50 mL  INTRAOPERATIVE FLUIDS:  A liter.  URINE OUTPUT: 200 mL  COUNTS: Sponge and instrument counts x2 was correct .  COMPLICATIONS: None.  DISPOSITION: The patient tolerated the procedure well and was transferred to recovery  room in stable condition.      PAA D: 11/26/2023 12:14:21 am T: 11/26/2023 1:53:00 am  JOB: [id number]/ [id number]

## 2023-12-05 ENCOUNTER — Encounter: Payer: Self-pay | Admitting: Radiology

## 2023-12-05 LAB — SURGICAL PATHOLOGY

## 2024-01-22 ENCOUNTER — Encounter
